# Patient Record
Sex: Female | Born: 1990 | Race: Black or African American | Hispanic: No | Marital: Single | Smoking: Never smoker
Health system: Southern US, Community
[De-identification: ages and names within clinical notes are randomized; demographics above are authoritative.]

## PROBLEM LIST (undated history)

## (undated) DIAGNOSIS — R51 Headache: Secondary | ICD-10-CM

## (undated) DIAGNOSIS — R519 Headache, unspecified: Secondary | ICD-10-CM

## (undated) HISTORY — DX: Headache: R51

## (undated) HISTORY — DX: Headache, unspecified: R51.9

---

## 2008-05-02 ENCOUNTER — Emergency Department (HOSPITAL_COMMUNITY): Admission: EM | Admit: 2008-05-02 | Discharge: 2008-05-02 | Payer: Self-pay | Admitting: Emergency Medicine

## 2009-06-27 ENCOUNTER — Emergency Department (HOSPITAL_COMMUNITY): Admission: EM | Admit: 2009-06-27 | Discharge: 2009-06-27 | Payer: Self-pay | Admitting: Emergency Medicine

## 2010-07-21 ENCOUNTER — Emergency Department (HOSPITAL_COMMUNITY)
Admission: EM | Admit: 2010-07-21 | Discharge: 2010-07-21 | Payer: Self-pay | Source: Home / Self Care | Admitting: Family Medicine

## 2010-07-27 LAB — POCT URINALYSIS DIPSTICK
Bilirubin Urine: NEGATIVE
Ketones, ur: NEGATIVE mg/dL
Nitrite: NEGATIVE
Protein, ur: >=300 mg/dL — AB
Specific Gravity, Urine: >=1.03 (ref 1.005–1.030)
Urine Glucose, Fasting: NEGATIVE mg/dL
Urobilinogen, UA: 0.2 mg/dL (ref 0.0–1.0)
pH: 6 (ref 5.0–8.0)

## 2010-07-27 LAB — POCT PREGNANCY, URINE: Preg Test, Ur: NEGATIVE

## 2010-10-11 ENCOUNTER — Emergency Department (HOSPITAL_COMMUNITY)
Admission: EM | Admit: 2010-10-11 | Discharge: 2010-10-11 | Disposition: A | Discharge status: Home / Self Care | Payer: BC Managed Care – PPO | Attending: Emergency Medicine | Admitting: Emergency Medicine

## 2010-10-11 DIAGNOSIS — R599 Enlarged lymph nodes, unspecified: Secondary | ICD-10-CM | POA: Insufficient documentation

## 2010-10-11 DIAGNOSIS — IMO0001 Reserved for inherently not codable concepts without codable children: Secondary | ICD-10-CM | POA: Insufficient documentation

## 2010-10-11 DIAGNOSIS — R630 Anorexia: Secondary | ICD-10-CM | POA: Insufficient documentation

## 2010-10-11 DIAGNOSIS — J020 Streptococcal pharyngitis: Secondary | ICD-10-CM | POA: Insufficient documentation

## 2010-10-11 DIAGNOSIS — R070 Pain in throat: Secondary | ICD-10-CM | POA: Insufficient documentation

## 2010-10-11 DIAGNOSIS — R509 Fever, unspecified: Secondary | ICD-10-CM | POA: Insufficient documentation

## 2010-10-11 LAB — RAPID STREP SCREEN (MED CTR MEBANE ONLY): Streptococcus, Group A Screen (Direct): POSITIVE — AB

## 2010-12-15 ENCOUNTER — Inpatient Hospital Stay (INDEPENDENT_AMBULATORY_CARE_PROVIDER_SITE_OTHER)
Admission: RE | Admit: 2010-12-15 | Discharge: 2010-12-15 | Disposition: A | Discharge status: Home / Self Care | Payer: BC Managed Care – PPO | Source: Ambulatory Visit | Attending: Family Medicine | Admitting: Family Medicine

## 2010-12-15 DIAGNOSIS — N760 Acute vaginitis: Secondary | ICD-10-CM

## 2010-12-15 DIAGNOSIS — R300 Dysuria: Secondary | ICD-10-CM

## 2010-12-15 DIAGNOSIS — A499 Bacterial infection, unspecified: Secondary | ICD-10-CM

## 2010-12-15 LAB — POCT URINALYSIS DIP (DEVICE)
Bilirubin Urine: NEGATIVE
Glucose, UA: NEGATIVE mg/dL
Hgb urine dipstick: NEGATIVE
Ketones, ur: NEGATIVE mg/dL
Nitrite: NEGATIVE
Protein, ur: NEGATIVE mg/dL
Specific Gravity, Urine: 1.025 (ref 1.005–1.030)
Urobilinogen, UA: 0.2 mg/dL (ref 0.0–1.0)
pH: 6 (ref 5.0–8.0)

## 2010-12-15 LAB — POCT PREGNANCY, URINE: Preg Test, Ur: NEGATIVE

## 2010-12-15 LAB — WET PREP, GENITAL
Clue Cells Wet Prep HPF POC: NONE SEEN
Trich, Wet Prep: NONE SEEN
Yeast Wet Prep HPF POC: NONE SEEN

## 2010-12-16 LAB — GC/CHLAMYDIA PROBE AMP, GENITAL
Chlamydia, DNA Probe: NEGATIVE
GC Probe Amp, Genital: NEGATIVE

## 2012-08-28 ENCOUNTER — Telehealth: Payer: Self-pay

## 2012-08-28 NOTE — Telephone Encounter (Signed)
Pt is requesting to pick of a copy of her tb that she had done maybe a year ago please call [phone] when ready

## 2012-08-28 NOTE — Telephone Encounter (Signed)
Patient notified today that they are ready for pick up. Patient says she will get them tomorrow.

## 2012-09-04 ENCOUNTER — Ambulatory Visit (INDEPENDENT_AMBULATORY_CARE_PROVIDER_SITE_OTHER): Payer: BC Managed Care – PPO | Admitting: Family Medicine

## 2012-09-04 VITALS — BP 113/69 | HR 79 | Temp 98.5°F | Resp 16 | Ht 66.0 in | Wt 146.8 lb

## 2012-09-04 DIAGNOSIS — Z0000 Encounter for general adult medical examination without abnormal findings: Secondary | ICD-10-CM

## 2012-09-04 NOTE — Patient Instructions (Signed)
Return as needed. Remember our discussion regarding healthy lifestyle choices.

## 2012-09-04 NOTE — Progress Notes (Signed)
Physical examination  Current medical condition: 22 year old lady who is here for physical exam. 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 She also is in graduate school to early childhood education. She has no major acute medical complaints.  Past medical history: Previous surgeries: None  previous medical illnesses: None Prescription medications: None except for getting the periodic Depo-Provera and injections. She takes some supplements. Allergies: None  Family history: Mother is healthy Father has HIV, dementia. He is estranged from his daughter and they really don't have a relationship 2 siblings who have some health issues  Social history: Patient is single. She has one boyfriend who she she is sexually involved with. She does not smoke or use any drugs. She occasionally drinks alcohol. 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  Review of systems: Constitutional: Unremarkable HEENT: Unremarkable Respiratory: Unremarkable Cardiovascular: Unremarkable GI: Unremarkable GU: Unremarkable The skeletal: Unremarkable. She does have some discomfort in the anterior aspect of her neck recently. Is fairly nonspecific in nature. Neurologic: Unremarkable Endocrinologic: Unremarkable GYN: Unremarkable Does not have periods because of her injections.  Physical examination: Well-developed well-nourished young lady in no major distress. Her HEENT is normal. Eyes PERRLA. Fundi benign. Throat clear. Neck supple without nodes thyromegaly. No carotid bruits. Chest clear to auscultation. Heart regular without murmurs gallops or arrhythmias. And soft without masses tenderness. Spine normal. Extremities unremarkable. Skin unremarkable. She has a couple of tattoos, the right shoulder and right leg. Deep tender reflexes trace, symmetrical.  Assessment: Normal physical examination Nonspecific anterior cervical pain  Plan: Form completed Return when necessary

## 2012-09-16 ENCOUNTER — Encounter: Payer: Self-pay | Admitting: Physician Assistant

## 2012-09-16 ENCOUNTER — Ambulatory Visit (INDEPENDENT_AMBULATORY_CARE_PROVIDER_SITE_OTHER): Payer: BC Managed Care – PPO | Admitting: Physician Assistant

## 2012-09-16 VITALS — BP 116/76 | HR 66 | Temp 98.3°F | Resp 16 | Ht 66.0 in | Wt 146.0 lb

## 2012-09-16 NOTE — Progress Notes (Signed)
  Tuberculosis Risk Questionnaire  1. Were you born outside the [place] in one of the following parts of the world:    [place], [place], [place], [place] or [place]?  No  2. Have you traveled outside the [place] and lived for more than one month in one of the following parts of the world:  [place], [place], [place], [place] or [place]?  No  3. Do you have a compromised immune system such as from any of the following conditions:  HIV/AIDS, organ or bone marrow transplantation, diabetes, immunosuppressive   medicines (e.g. Prednisone, Remicaide), leukemia, lymphoma, cancer of the   head or neck, gastrectomy or jejunal bypass, end-stage renal disease (on   dialysis), or silicosis?  No    4. Have you ever done one of the following:    Used crack cocaine, injected illegal drugs, worked or resided in jail or prison,   worked or resided at a homeless shelter, or worked as a Research scientist (physical sciences) in   direct contact with patients?  No  5. Have you ever been exposed to anyone with infectious tuberculosis?  Yes  Grandmother had tuberculosis she passed away 15 yrs ago   Tuberculosis Symptom Questionnaire  Do you currently have any of the following symptoms?  1. Unexplained cough lasting more than 3 weeks? No  Unexplained fever lasting more than 3 weeks. No   3. Night Sweats (sweating that leaves the bedclothes and sheets wet)   No  4. Shortness of Breath No  5. Chest Pain No  6. Unintentional weight loss  No  7. Unexplained fatigue (very tired for no reason) No  Patient with one positive yes answer discussed with Porfirio Oar and TB skin test applied patient to return in 48-72 hours for reading.

## 2012-09-16 NOTE — Progress Notes (Signed)
TB screening reviewed.  Patient was exposed to TB when her grandmother was infected.  Per CDC recommendations, she needs additional screening. PPD placed.  RTC 48-72 hours for reading.

## 2012-09-18 ENCOUNTER — Encounter: Payer: Self-pay | Admitting: *Deleted

## 2012-09-18 ENCOUNTER — Ambulatory Visit (INDEPENDENT_AMBULATORY_CARE_PROVIDER_SITE_OTHER): Payer: BC Managed Care – PPO | Admitting: *Deleted

## 2012-09-18 LAB — TB SKIN TEST
Induration: 0 mm
TB Skin Test: NEGATIVE

## 2013-04-05 ENCOUNTER — Emergency Department (HOSPITAL_COMMUNITY): Payer: BC Managed Care – PPO

## 2013-04-05 ENCOUNTER — Emergency Department (HOSPITAL_COMMUNITY)
Admission: EM | Admit: 2013-04-05 | Discharge: 2013-04-05 | Disposition: A | Discharge status: Home / Self Care | Payer: BC Managed Care – PPO | Attending: Emergency Medicine | Admitting: Emergency Medicine

## 2013-04-05 ENCOUNTER — Encounter (HOSPITAL_COMMUNITY): Payer: Self-pay | Admitting: Emergency Medicine

## 2013-04-05 DIAGNOSIS — R079 Chest pain, unspecified: Secondary | ICD-10-CM

## 2013-04-05 DIAGNOSIS — Z3202 Encounter for pregnancy test, result negative: Secondary | ICD-10-CM | POA: Insufficient documentation

## 2013-04-05 LAB — CBC
HCT: 41.7 % (ref 36.0–46.0)
Hemoglobin: 14.6 g/dL (ref 12.0–15.0)
MCH: 31.3 pg (ref 26.0–34.0)
MCHC: 35 g/dL (ref 30.0–36.0)
MCV: 89.5 fL (ref 78.0–100.0)
Platelets: 195 10*3/uL (ref 150–400)
RBC: 4.66 MIL/uL (ref 3.87–5.11)
RDW: 12.6 % (ref 11.5–15.5)
WBC: 2.7 10*3/uL — ABNORMAL LOW (ref 4.0–10.5)

## 2013-04-05 LAB — BASIC METABOLIC PANEL
BUN: 12 mg/dL (ref 6–23)
CO2: 26 mEq/L (ref 19–32)
Calcium: 9.7 mg/dL (ref 8.4–10.5)
Chloride: 103 mEq/L (ref 96–112)
Creatinine, Ser: 0.87 mg/dL (ref 0.50–1.10)
GFR calc Af Amer: >90 mL/min (ref >90)
GFR calc non Af Amer: >90 mL/min (ref >90)
Glucose, Bld: 81 mg/dL (ref 70–99)
Potassium: 3.6 mEq/L (ref 3.5–5.1)
Sodium: 138 mEq/L (ref 135–145)

## 2013-04-05 LAB — POCT I-STAT TROPONIN I: Troponin i, poc: 0 ng/mL (ref 0.00–0.08)

## 2013-04-05 LAB — POCT PREGNANCY, URINE: Preg Test, Ur: NEGATIVE

## 2013-04-05 MED ORDER — OMEPRAZOLE 20 MG PO CPDR: 20.0000 mg | DELAYED_RELEASE_CAPSULE | Freq: Every day | ORAL | Status: DC

## 2013-04-05 MED ORDER — GI COCKTAIL ~~LOC~~
30.0000 mL | Freq: Once | ORAL | Status: AC
  Administered 2013-04-05: 30 mL via ORAL
  Filled 2013-04-05: qty 30

## 2013-04-05 MED ORDER — FAMOTIDINE 20 MG PO TABS: 20.0000 mg | ORAL_TABLET | Freq: Two times a day (BID) | ORAL | Status: DC

## 2013-04-05 NOTE — ED Provider Notes (Signed)
CSN: [id number]     Arrival date & time 04/05/13  1126 History   First MD Initiated Contact with Patient 04/05/13 1334     Chief Complaint  Patient presents with  . Chest Pain  . Abdominal Pain   (Consider location/radiation/quality/duration/timing/severity/associated sxs/prior Treatment) HPI  22 year old female with no significant past medical history presents complaining of epigastric abdominal pain. Patient states pain is waxing and waning for the past 3 or 4 days. Describe pain as a "rolling sensation" with occasional sharp pain, epigastric and radiates to her mid chest. Pain worsening at night when she lays down and woke her up this morning.  As it worse pain was 10 out of 10 but pain is currently 4/10. Endorse mild nausea but no vomiting or diarrhea. Denies any fever, chills, exertional chest pain, pleuritic chest pain, productive cough, hemoptysis, shortness of breath, back pain, dysuria, or rash. Denies any lightheadedness and dizziness. No prior history of GERD. Pt tried taking Gas-X without relief.  Her last menstrual period was in May currently on Depo-Provera. Her last bowel movement was yesterday and was normal. Patient otherwise is a nonsmoker. No significant family history of premature cardiac death. No history of diabetes.  History reviewed. No pertinent past medical history. History reviewed. No pertinent past surgical history. Family History  Problem Relation Age of Onset  . HIV/AIDS Father   . Asthma Sister   . Hypertension Maternal Grandmother    History  Substance Use Topics  . Smoking status: Never Smoker   . Smokeless tobacco: Not on file  . Alcohol Use: Yes     Comment: drinks occasionally   OB History   Grav Para Term Preterm Abortions TAB SAB Ect Mult Living                 Review of Systems  All other systems reviewed and are negative.    Allergies  Review of patient's allergies indicates no known allergies.  Home Medications   Current Outpatient  Rx  Name  Route  Sig  Dispense  Refill  . Ascorbic Acid (VITAMIN C PO)   Oral   Take 1 tablet by mouth daily.         Marland Kitchen BIOTIN PO   Oral   Take 1 tablet by mouth daily.         Marland Kitchen CALCIUM PO   Oral   Take 1 tablet by mouth 2 (two) times daily.         . medroxyPROGESTERone (DEPO-PROVERA) 400 MG/ML SUSP   Intramuscular   Inject into the muscle once.         . Multiple Vitamins-Minerals (MULTIVITAMIN WITH MINERALS) tablet   Oral   Take 1 tablet by mouth daily.         . Simethicone (ANTI GAS PO)   Oral   Take 2 tablets by mouth once as needed (for gas or upset stomach).          BP 117/72  Pulse 82  Temp(Src) 98.4 F (36.9 C) (Oral)  Resp 18  Ht 5\' 6"  (1.676 m)  Wt 164 lb (74.39 kg)  BMI 26.48 kg/m2  SpO2 97% Physical Exam  Nursing note and vitals reviewed. Constitutional: She appears well-developed and well-nourished. No distress.  Awake, alert, nontoxic appearance  HENT:  Head: Atraumatic.  Eyes: Conjunctivae are normal. Right eye exhibits no discharge. Left eye exhibits no discharge.  Neck: Neck supple.  Cardiovascular: Normal rate and regular rhythm.   Pulmonary/Chest: Effort normal. No  respiratory distress. She exhibits no tenderness.  Abdominal: Soft. There is no tenderness. There is no rebound.  Musculoskeletal: She exhibits no tenderness.  ROM appears intact, no obvious focal weakness  Neurological:  Mental status and motor strength appears intact  Skin: No rash noted.  Psychiatric: She has a normal mood and affect.    ED Course  Procedures (including critical care time)   Date: 04/05/2013  Rate: 81  Rhythm: normal sinus rhythm  QRS Axis: normal  Intervals: normal  ST/T Wave abnormalities: normal  Conduction Disutrbances: none  Narrative Interpretation: poor R wave progression  Old EKG Reviewed: none for comparison     1:47 PM Patient presents with atypical chest pain. Her symptoms is suggestive of acid reflux. She has no  significant cardiac risk factor. She does take Depo-Provera however she denies any shortness of breath to concern for PE.  She has a normal pregnancy test, unremarkable troponin, and her labs are reassuring. Her chest x-ray shows no acute disease. We'll give GI cocktail.    Labs Review Labs Reviewed  CBC - Abnormal; Notable for the following:    WBC 2.7 (*)    All other components within normal limits  BASIC METABOLIC PANEL  POCT I-STAT TROPONIN I  POCT PREGNANCY, URINE   Imaging Review Dg Chest 2 View  04/05/2013   CLINICAL DATA:  Chest pain  EXAM: CHEST  2 VIEW  COMPARISON:  June 27, 2009  FINDINGS: There is no focal infiltrate, pulmonary edema, or pleural effusion. The mediastinal contour and cardiac silhouette are normal. The soft tissues and osseous structures are stable.  IMPRESSION: No active cardiopulmonary disease.   Electronically Signed   By: Sherian Rein   On: 04/05/2013 12:42    MDM   1. Chest pain at rest    BP 97/64  Pulse 64  Temp(Src) 98.4 F (36.9 C) (Oral)  Resp 18  Ht 5\' 6"  (1.676 m)  Wt 164 lb (74.39 kg)  BMI 26.48 kg/m2  SpO2 100%  I have reviewed nursing notes and vital signs. I personally reviewed the imaging tests through PACS system  I reviewed available ER/hospitalization records thought the EMR     Fayrene Helper, [place] 04/06/13 1503

## 2013-04-05 NOTE — ED Notes (Signed)
Pt c/o mid sternal CP and abd pain x 1 week worse after eating

## 2013-04-05 NOTE — ED Notes (Signed)
Pt discharged.Vital signs stable and GCS 15 

## 2013-04-17 NOTE — ED Provider Notes (Signed)
Medical screening examination/treatment/procedure(s) were performed by non-physician practitioner and as supervising physician I was immediately available for consultation/collaboration. Devoria Albe, MD, Armando Gang   Ward Givens, MD 04/17/13 1058

## 2013-11-30 ENCOUNTER — Ambulatory Visit (INDEPENDENT_AMBULATORY_CARE_PROVIDER_SITE_OTHER): Payer: 59 | Admitting: Family Medicine

## 2013-11-30 ENCOUNTER — Telehealth: Payer: Self-pay

## 2013-11-30 VITALS — BP 108/70 | HR 60 | Temp 98.5°F | Resp 18 | Ht 64.2 in | Wt 168.4 lb

## 2013-11-30 DIAGNOSIS — M542 Cervicalgia: Secondary | ICD-10-CM

## 2013-11-30 DIAGNOSIS — Z0000 Encounter for general adult medical examination without abnormal findings: Secondary | ICD-10-CM

## 2013-11-30 DIAGNOSIS — R05 Cough: Secondary | ICD-10-CM

## 2013-11-30 DIAGNOSIS — R059 Cough, unspecified: Secondary | ICD-10-CM

## 2013-11-30 LAB — POCT CBC
Granulocyte percent: 43.4 %G (ref 37–80)
HCT, POC: 40.9 % (ref 37.7–47.9)
Hemoglobin: 12.9 g/dL (ref 12.2–16.2)
Lymph, poc: 2.3 (ref 0.6–3.4)
MCH, POC: 29.6 pg (ref 27–31.2)
MCHC: 31.5 g/dL — AB (ref 31.8–35.4)
MCV: 93.8 fL (ref 80–97)
MID (cbc): 0.3 (ref 0–0.9)
MPV: 10.9 fL (ref 0–99.8)
POC Granulocyte: 2 (ref 2–6.9)
POC LYMPH PERCENT: 49.2 %L (ref 10–50)
POC MID %: 7.4 %M (ref 0–12)
Platelet Count, POC: 196 10*3/uL (ref 142–424)
RBC: 4.36 M/uL (ref 4.04–5.48)
RDW, POC: 13.7 %
WBC: 4.6 10*3/uL (ref 4.6–10.2)

## 2013-11-30 LAB — COMPREHENSIVE METABOLIC PANEL
ALT: 21 U/L (ref 0–35)
AST: 22 U/L (ref 0–37)
Albumin: 4 g/dL (ref 3.5–5.2)
Alkaline Phosphatase: 59 U/L (ref 39–117)
BUN: 18 mg/dL (ref 6–23)
CO2: 23 mEq/L (ref 19–32)
Calcium: 9.5 mg/dL (ref 8.4–10.5)
Chloride: 106 mEq/L (ref 96–112)
Creat: 0.86 mg/dL (ref 0.50–1.10)
Glucose, Bld: 80 mg/dL (ref 70–99)
Potassium: 4 mEq/L (ref 3.5–5.3)
Sodium: 139 mEq/L (ref 135–145)
Total Bilirubin: 0.4 mg/dL (ref 0.2–1.2)
Total Protein: 7.6 g/dL (ref 6.0–8.3)

## 2013-11-30 LAB — POCT URINALYSIS DIPSTICK
Bilirubin, UA: NEGATIVE
Blood, UA: NEGATIVE
Glucose, UA: NEGATIVE
Ketones, UA: NEGATIVE
Leukocytes, UA: NEGATIVE
Nitrite, UA: POSITIVE
Protein, UA: NEGATIVE
Spec Grav, UA: 1.02
Urobilinogen, UA: 0.2
pH, UA: 7

## 2013-11-30 LAB — LIPID PANEL
Cholesterol: 154 mg/dL (ref 0–200)
HDL: 56 mg/dL (ref >39)
LDL Cholesterol: 88 mg/dL (ref 0–99)
Total CHOL/HDL Ratio: 2.8 Ratio
Triglycerides: 50 mg/dL (ref <150)
VLDL: 10 mg/dL (ref 0–40)

## 2013-11-30 LAB — POCT SEDIMENTATION RATE: POCT SED RATE: 51 mm/hr — AB (ref 0–22)

## 2013-11-30 MED ORDER — CYCLOBENZAPRINE HCL 5 MG PO TABS: 5.0000 mg | ORAL_TABLET | Freq: Every day | ORAL | Status: DC

## 2013-11-30 MED ORDER — FLUTICASONE FUROATE 100 MCG/ACT IN AEPB: 1.0000 | INHALATION_SPRAY | Freq: Two times a day (BID) | RESPIRATORY_TRACT | Status: DC

## 2013-11-30 MED ORDER — MELOXICAM 7.5 MG PO TABS: 7.5000 mg | ORAL_TABLET | Freq: Every day | ORAL | Status: DC

## 2013-11-30 MED ORDER — FLUTICASONE PROPIONATE HFA 110 MCG/ACT IN AERO: 1.0000 | INHALATION_SPRAY | Freq: Two times a day (BID) | RESPIRATORY_TRACT | Status: DC

## 2013-11-30 NOTE — Progress Notes (Signed)
°  This chart was scribed for Anita Hai, MD by Joaquin Music, ED Scribe. This patient was seen in room Room/bed 2 and the patient's care was started at 1:59 PM. Subjective:    Patient ID: Frederich Chick, female    DOB: 1991/02/10, 23 y.o.   MRN: [id number] Chief Complaint  Patient presents with   Annual Exam    with no pap   HPI ANNALEIGH KNOCHE is a 23 y.o. female who presents to the [facility] for annual physical exam. States her last physical was December 2014. Pt states she has been having neck pain with associated intermittent numbness for the past 3 months but for the past month it has been constant. Pt states the pain stretches to her shoulder blades but no further. States she has been stretching but denies any relief. Reports having a hx of lower back back. She denies having an new or reoccurring sx.   She also c/o having a spontaneous productive cough for the past year. States she noticed her cough began when she was using ammonium to clean and states the cough has not subsided since initial use. She denies having hx of asthma or smoking.Family hx of HTN. Uses Depoprivera as BC given by OBGYN. Excercises frequently that consist of cardio.  Works in [employer]. Denies heavy lifting while at work.  Review of Systems  Musculoskeletal: Positive for myalgias and neck pain. Negative for neck stiffness.  All other systems reviewed and are negative.  Objective:   Physical Exam  Nursing note and vitals reviewed. Constitutional: She is oriented to person, place, and time. She appears well-developed and well-nourished. No distress.  HENT:  Head: Normocephalic and atraumatic.  Right Ear: External ear normal.  Left Ear: External ear normal.  Nose: Nose normal.  Mouth/Throat: Oropharynx is clear and moist.  Eyes: Conjunctivae and EOM are normal. Pupils are equal, round, and reactive to light.  Neck: Normal range of motion. Neck supple. No tracheal deviation present.  Mildly tight  muscles to her neck.  Cardiovascular: Normal rate, regular rhythm, normal heart sounds and intact distal pulses.   Pulmonary/Chest: Effort normal and breath sounds normal. No respiratory distress.  Abdominal: Soft. Bowel sounds are normal.  Musculoskeletal: Normal range of motion.  Neurological: She is alert and oriented to person, place, and time. She has normal reflexes.  Normal reflexes.  Skin: Skin is warm and dry.  Psychiatric: She has a normal mood and affect. Her behavior is normal.   Triage Vitals:BP 108/70   Pulse 60   Temp(Src) 98.5 F (36.9 C) (Oral)   Resp 18   Ht 5' 4.2" (1.631 m)   Wt 168 lb 6.4 oz (76.386 kg)   BMI 28.71 kg/m2   SpO2 100% Assessment & Plan:  Will complete labs and discharge pt with inhaler and medication for neck pain.   Cough - Plan: Fluticasone Furoate (ARNUITY ELLIPTA) 100 MCG/ACT AEPB  Neck pain - Plan: meloxicam (MOBIC) 7.5 MG tablet, cyclobenzaprine (FLEXERIL) 5 MG tablet, POCT SEDIMENTATION RATE  Annual physical exam - Plan: POCT CBC, Comprehensive metabolic panel, Lipid panel, POCT urinalysis dipstick  Signed, Elvina Sidle, MD

## 2013-11-30 NOTE — Telephone Encounter (Signed)
Pharm called to advise that Fluticasone furonate is not covered under insurance and asked if we can change it to Flovent. Per Dr Elbert Ewings, Ok'd change to Flovent.

## 2013-12-05 ENCOUNTER — Telehealth: Payer: Self-pay

## 2013-12-05 DIAGNOSIS — R059 Cough, unspecified: Secondary | ICD-10-CM

## 2013-12-05 DIAGNOSIS — R05 Cough: Secondary | ICD-10-CM

## 2013-12-05 NOTE — Telephone Encounter (Signed)
PT STATES SHE WAS GIVEN A COUPON FOR AN INHALER BUT WHEN SHE WENT TO THE PHARMACY THEY GAVE HER A DIFFERENT ONE WHICH HER INSURANCE DOESN'T COVER WOULD LIKE TO SPEAK WITH SOMEONE ABOUT IT. PLEASE CALL [phone]

## 2013-12-06 MED ORDER — ARNUITY ELLIPTA 100 MCG/ACT IN AEPB: 1.0000 | INHALATION_SPRAY | Freq: Two times a day (BID) | RESPIRATORY_TRACT | Status: DC

## 2013-12-06 NOTE — Telephone Encounter (Signed)
Pt tried to use the coupon given to her for the inhaler for the prescription that was filled with a generic form. Resent Rx to pharmacy with DAW so patient may use the free inhaler coupon. Pt will go back to the pharmacy to pick up.

## 2014-01-07 ENCOUNTER — Ambulatory Visit (INDEPENDENT_AMBULATORY_CARE_PROVIDER_SITE_OTHER): Payer: 59 | Admitting: Family Medicine

## 2014-01-07 VITALS — BP 118/75 | HR 66 | Temp 98.6°F | Ht 64.0 in | Wt 168.2 lb

## 2014-01-07 DIAGNOSIS — J029 Acute pharyngitis, unspecified: Secondary | ICD-10-CM

## 2014-01-07 DIAGNOSIS — J309 Allergic rhinitis, unspecified: Secondary | ICD-10-CM

## 2014-01-07 LAB — POCT RAPID STREP A (OFFICE): Rapid Strep A Screen: NEGATIVE

## 2014-01-07 MED ORDER — FLUTICASONE PROPIONATE 50 MCG/ACT NA SUSP: 2.0000 | Freq: Every day | NASAL | Status: DC

## 2014-01-07 NOTE — Patient Instructions (Signed)
Strep test was negative, culture will be sent and we will let you know results in 2-3 days Can take ibuprofen 2-3 tablets every 8 hours for pain Gargle with warm salt water and can use chloraseptic spray over the counter as needed for throat pain Start nasal spray and use once a day for a month Return if you get worse- fever greater than 101 for greater than 2 days, persistent nausea/vomiting  Pharyngitis Pharyngitis is redness, pain, and swelling (inflammation) of your pharynx.  CAUSES  Pharyngitis is usually caused by infection. Most of the time, these infections are from viruses (viral) and are part of a cold. However, sometimes pharyngitis is caused by bacteria (bacterial). Pharyngitis can also be caused by allergies. Viral pharyngitis may be spread from person to person by coughing, sneezing, and personal items or utensils (cups, forks, spoons, toothbrushes). Bacterial pharyngitis may be spread from person to person by more intimate contact, such as kissing.  SIGNS AND SYMPTOMS  Symptoms of pharyngitis include:   Sore throat.   Tiredness (fatigue).   Low-grade fever.   Headache.  Joint pain and muscle aches.  Skin rashes.  Swollen lymph nodes.  Plaque-like film on throat or tonsils (often seen with bacterial pharyngitis). DIAGNOSIS  Your health care provider will ask you questions about your illness and your symptoms. Your medical history, along with a physical exam, is often all that is needed to diagnose pharyngitis. Sometimes, a rapid strep test is done. Other lab tests may also be done, depending on the suspected cause.  TREATMENT  Viral pharyngitis will usually get better in 3-4 days without the use of medicine. Bacterial pharyngitis is treated with medicines that kill germs (antibiotics).  HOME CARE INSTRUCTIONS   Drink enough water and fluids to keep your urine clear or pale yellow.   Only take over-the-counter or prescription medicines as directed by your health  care provider:   If you are prescribed antibiotics, make sure you finish them even if you start to feel better.   Do not take aspirin.   Get lots of rest.   Gargle with 8 oz of salt water ( tsp of salt per 1 qt of water) as often as every 1-2 hours to soothe your throat.   Throat lozenges (if you are not at risk for choking) or sprays may be used to soothe your throat. SEEK MEDICAL CARE IF:   You have large, tender lumps in your neck.  You have a rash.  You cough up green, yellow-brown, or bloody spit. SEEK IMMEDIATE MEDICAL CARE IF:   Your neck becomes stiff.  You drool or are unable to swallow liquids.  You vomit or are unable to keep medicines or liquids down.  You have severe pain that does not go away with the use of recommended medicines.  You have trouble breathing (not caused by a stuffy nose). MAKE SURE YOU:   Understand these instructions.  Will watch your condition.  Will get help right away if you are not doing well or get worse. Document Released: 06/28/2005 Document Revised: 04/18/2013 Document Reviewed: 03/05/2013 [facility] Patient Information 2015 [place], [place]. This information is not intended to replace advice given to you by your health care provider. Make sure you discuss any questions you have with your health care provider.

## 2014-01-07 NOTE — Progress Notes (Signed)
   Subjective:    Patient ID: Anita Leblanc, female    DOB: [birth year], 23 y.o.   MRN: [id number]  HPI This is a very pleasant 23 yo female who presents today with 3 day history of coughing, sneezing, congestion, watery eyes. Has also had sore throat x 2 days and noticed spots on the back of her throat. 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  She does not typically have allergic symptoms, but this spring has had more nasal congestion. Has taken an OTC cold preparation with some relief of symptoms.  Review of Systems Subjective fever yesterday, dry cough, ear pressure, no SOB, no myalgias/arthalgias, no rash.    Objective:   Physical Exam  Vitals reviewed. Constitutional: She is oriented to person, place, and time. She appears well-developed and well-nourished. No distress.  HENT:  Head: Normocephalic and atraumatic.  Right Ear: Tympanic membrane, external ear and ear canal normal.  Left Ear: Tympanic membrane, external ear and ear canal normal.  Nose: Mucosal edema and rhinorrhea present. Right sinus exhibits maxillary sinus tenderness. Right sinus exhibits no frontal sinus tenderness. Left sinus exhibits maxillary sinus tenderness. Left sinus exhibits no frontal sinus tenderness.  Mouth/Throat: Uvula is midline and mucous membranes are normal. Posterior oropharyngeal edema and posterior oropharyngeal erythema present. No oropharyngeal exudate.  Eyes: Conjunctivae are normal. Right eye exhibits no discharge. Left eye exhibits no discharge.  Neck: Normal range of motion. Neck supple.  Cardiovascular: Normal rate, regular rhythm and normal heart sounds.   Pulmonary/Chest: Effort normal and breath sounds normal.  Abdominal: Soft.  Musculoskeletal: Normal range of motion.  Lymphadenopathy:    She has no cervical adenopathy.  Neurological: She is alert and oriented to person, place, and time.  Skin: Skin is warm and dry. No rash noted. She is not  diaphoretic.  Psychiatric: She has a normal mood and affect. Her behavior is normal. Judgment and thought content normal.       Assessment & Plan:  1. Acute pharyngitis, unspecified pharyngitis type - POCT rapid strep A - Culture, Group A Strep  2. Allergic rhinitis, unspecified allergic rhinitis type - fluticasone (FLONASE) 50 MCG/ACT nasal spray; Place 2 sprays into both nostrils daily.  Dispense: 16 g; Refill: 6 -provided written and verbal information regarding diagnoses, symptomatic relief.  -RTC if no improvement in 5-7 days or if worsening symptoms    Emi Belfasteborah B. Gessner, FNP-BC  [facility], [facility]  01/07/2014 8:19 PM

## 2014-01-10 LAB — CULTURE, GROUP A STREP: Organism ID, Bacteria: NORMAL

## 2014-05-10 ENCOUNTER — Encounter: Payer: 59 | Admitting: Family Medicine

## 2014-05-13 NOTE — Progress Notes (Signed)
This encounter was created in error - please disregard.

## 2014-10-25 ENCOUNTER — Ambulatory Visit (INDEPENDENT_AMBULATORY_CARE_PROVIDER_SITE_OTHER): Payer: 59 | Admitting: Family Medicine

## 2014-10-25 VITALS — BP 110/68 | HR 78 | Temp 97.8°F | Resp 16 | Ht 64.0 in | Wt 185.0 lb

## 2014-10-25 DIAGNOSIS — Z7189 Other specified counseling: Secondary | ICD-10-CM | POA: Diagnosis not present

## 2014-10-25 DIAGNOSIS — Z7185 Encounter for immunization safety counseling: Secondary | ICD-10-CM

## 2014-10-25 DIAGNOSIS — Z23 Encounter for immunization: Secondary | ICD-10-CM

## 2014-10-25 NOTE — Progress Notes (Signed)
Subjective:  This chart was scribed for Anita Ray, MD by Anita Leblanc, Medical scribe. This patient was seen in ROOM 4 and the patient's care was started 4:47 PM.  Patient ID: Anita Leblanc, female    DOB: 08-01-1990, 24 y.o.   MRN: [id number]   Chief Complaint  Patient presents with  . Immunizations    Tetanus and Vericella for school   HPI HPI Comments: Anita Leblanc is a 24 y.o. female with no medical history who presents to [facility] for her tetanus and Vericella immunizations for [employer] in the fall. 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 Pt states she is UTD on her MMR vaccine. She says that she did not receive a flu shot last year. Pt states that her mother told her she had chicken pox when she was a child but she does not remember. She reports no reactions to prior vaccinations. She reports no current illness or fevers recently.   There are no active problems to display for this patient.  History reviewed. No pertinent past medical history.   History reviewed. No pertinent past surgical history.   No Known Allergies   Prior to Admission medications   Medication Sig Start Date End Date Taking? Authorizing Provider  norgestimate-ethinyl estradiol (ORTHO-CYCLEN,SPRINTEC,PREVIFEM) 0.25-35 MG-MCG tablet Take 1 tablet by mouth daily.   Yes Historical Provider, MD  Ascorbic Acid (VITAMIN C PO) Take 1 tablet by mouth daily.    Historical Provider, MD  Multiple Vitamins-Minerals (MULTIVITAMIN WITH MINERALS) tablet Take 1 tablet by mouth daily.    Historical Provider, MD   History   Social History  . Marital Status: Single    Spouse Name: N/A  . Number of Children: N/A  . Years of Education: N/A   Occupational History  . Not on file.   Social History Main Topics  . Smoking status: Never Smoker   . Smokeless tobacco: Not on file  . Alcohol Use: 0.0 oz/week    0 Standard drinks or equivalent per week     Comment: drinks occasionally  . Drug Use:  No  . Sexual Activity: No   Other Topics Concern  . Not on file   Social History Narrative   Review of Systems  Constitutional: Negative for fever.       Immunizations      Objective:   Physical Exam  Constitutional: She is oriented to person, place, and time. She appears well-developed and well-nourished. No distress.  HENT:  Head: Normocephalic and atraumatic.  Right Ear: Hearing, tympanic membrane, external ear and ear canal normal.  Left Ear: Hearing, tympanic membrane, external ear and ear canal normal.  Nose: Nose normal.  Mouth/Throat: Oropharynx is clear and moist. No oropharyngeal exudate.  Eyes: Conjunctivae and EOM are normal. Pupils are equal, round, and reactive to light. Right eye exhibits no discharge. Left eye exhibits no discharge.  Neck: Neck supple. No tracheal deviation present.  Cardiovascular: Normal rate, regular rhythm, normal heart sounds and intact distal pulses.   No murmur heard. Pulmonary/Chest: Effort normal and breath sounds normal. No respiratory distress. She has no wheezes. She has no rhonchi. She has no rales.  Abdominal: She exhibits no distension.  Neurological: She is alert and oriented to person, place, and time.  Skin: Skin is warm and dry. No rash noted.  Psychiatric: She has a normal mood and affect. Her behavior is normal.  Nursing note and vitals reviewed.  Filed Vitals:   10/25/14 1556  BP: 110/68  Pulse: 78  Temp: 97.8 F (36.6 C)  TempSrc: Oral  Resp: 16  Height: '5\' 4"'  (1.626 m)  Weight: 185 lb (83.915 kg)  SpO2: 97%      Assessment & Plan:   Anita Leblanc is a 24 y.o. female Need for Tdap vaccination - Plan: Tdap vaccine greater than or equal to 7yo IM  - tdap given.   Immunization counseling - Plan: Varicella zoster antibody, IgG drawn.   Advised if other immunizations needed or paperwork to be completed for school to let me know.     Meds ordered this encounter  Medications  . norgestimate-ethinyl estradiol  (ORTHO-CYCLEN,SPRINTEC,PREVIFEM) 0.25-35 MG-MCG tablet    Sig: Take 1 tablet by mouth daily.   Patient Instructions  You should receive a call or letter about your lab results within the next week to 10 days.  Let me know if further information needed from your school, and good luck.   Tdap Vaccine (Tetanus, Diphtheria, Pertussis): What You Need to Know 1. Why get vaccinated? Tetanus, diphtheria and pertussis can be very serious diseases, even for adolescents and adults. Tdap vaccine can protect [place] from these diseases. TETANUS (Lockjaw) causes painful muscle tightening and stiffness, usually all over the body.  It can lead to tightening of muscles in the head and neck so you can't open your mouth, swallow, or sometimes even breathe. Tetanus kills about 1 out of 5 people who are infected. DIPHTHERIA can cause a thick coating to form in the back of the throat.  It can lead to breathing problems, paralysis, heart failure, and death. PERTUSSIS (Whooping Cough) causes severe coughing spells, which can cause difficulty breathing, vomiting and disturbed sleep.  It can also lead to weight loss, incontinence, and rib fractures. Up to 2 in 100 adolescents and 5 in 100 adults with pertussis are hospitalized or have complications, which could include pneumonia or death. These diseases are caused by bacteria. Diphtheria and pertussis are spread from person to person through coughing or sneezing. Tetanus enters the body through cuts, scratches, or wounds. Before vaccines, the [place] saw as many as 200,000 cases a year of diphtheria and pertussis, and hundreds of cases of tetanus. Since vaccination began, tetanus and diphtheria have dropped by about 99% and pertussis by about 80%. 2. Tdap vaccine Tdap vaccine can protect adolescents and adults from tetanus, diphtheria, and pertussis. One dose of Tdap is routinely given at age 34 or 53. People who did not get Tdap at that age should get it as soon as  possible. Tdap is especially important for health care professionals and anyone having close contact with a baby younger than 12 months. Pregnant women should get a dose of Tdap during every pregnancy, to protect the newborn from pertussis. Infants are most at risk for severe, life-threatening complications from pertussis. A similar vaccine, called Td, protects from tetanus and diphtheria, but not pertussis. A Td booster should be given every 10 years. Tdap may be given as one of these boosters if you have not already gotten a dose. Tdap may also be given after a severe cut or burn to prevent tetanus infection. Your doctor can give you more information. Tdap may safely be given at the same time as other vaccines. 3. Some people should not get this vaccine  If you ever had a life-threatening allergic reaction after a dose of any tetanus, diphtheria, or pertussis containing vaccine, OR if you have a severe allergy to any part of this  vaccine, you should not get Tdap. Tell your doctor if you have any severe allergies.  If you had a coma, or long or multiple seizures within 7 days after a childhood dose of DTP or DTaP, you should not get Tdap, unless a cause other than the vaccine was found. You can still get Td.  Talk to your doctor if you:  have epilepsy or another nervous system problem,  had severe pain or swelling after any vaccine containing diphtheria, tetanus or pertussis,  ever had Guillain-Barr Syndrome (GBS),  aren't feeling well on the day the shot is scheduled. 4. Risks of a vaccine reaction With any medicine, including vaccines, there is a chance of side effects. These are usually mild and go away on their own, but serious reactions are also possible. Brief fainting spells can follow a vaccination, leading to injuries from falling. Sitting or lying down for about 15 minutes can help prevent these. Tell your doctor if you feel dizzy or light-headed, or have vision changes or ringing  in the ears. Mild problems following Tdap (Did not interfere with activities)  Pain where the shot was given (about 3 in 4 adolescents or 2 in 3 adults)  Redness or swelling where the shot was given (about 1 person in 5)  Mild fever of at least 100.5F (up to about 1 in 25 adolescents or 1 in 100 adults)  Headache (about 3 or 4 people in 10)  Tiredness (about 1 person in 3 or 4)  Nausea, vomiting, diarrhea, stomach ache (up to 1 in 4 adolescents or 1 in 10 adults)  Chills, body aches, sore joints, rash, swollen glands (uncommon) Moderate problems following Tdap (Interfered with activities, but did not require medical attention)  Pain where the shot was given (about 1 in 5 adolescents or 1 in 100 adults)  Redness or swelling where the shot was given (up to about 1 in 16 adolescents or 1 in 25 adults)  Fever over 102F (about 1 in 100 adolescents or 1 in 250 adults)  Headache (about 3 in 20 adolescents or 1 in 10 adults)  Nausea, vomiting, diarrhea, stomach ache (up to 1 or 3 people in 100)  Swelling of the entire arm where the shot was given (up to about 3 in 100). Severe problems following Tdap (Unable to perform usual activities; required medical attention)  Swelling, severe pain, bleeding and redness in the arm where the shot was given (rare). A severe allergic reaction could occur after any vaccine (estimated less than 1 in a million doses). 5. What if there is a serious reaction? What should I look for?  Look for anything that concerns you, such as signs of a severe allergic reaction, very high fever, or behavior changes. Signs of a severe allergic reaction can include hives, swelling of the face and throat, difficulty breathing, a fast heartbeat, dizziness, and weakness. These would start a few minutes to a few hours after the vaccination. What should I do?  If you think it is a severe allergic reaction or other emergency that can't wait, call 9-1-1 or get the person  to the nearest hospital. Otherwise, call your doctor.  Afterward, the reaction should be reported to the "Vaccine Adverse Event Reporting System" (VAERS). Your doctor might file this report, or you can do it yourself through the VAERS web site at www.vaers.SamedayNews.es, or by calling (418) 001-5214. VAERS is only for reporting reactions. They do not give medical advice.  6. The National Vaccine Injury Fiserv The  National Sport and exercise psychologist (VICP) is a Technical brewer that was created to compensate people who may have been injured by certain vaccines. Persons who believe they may have been injured by a vaccine can learn about the program and about filing a claim by calling (229)684-7442 or visiting the [employer] website at GoldCloset.com.ee. 7. How can I learn more?  Ask your doctor.  Call your local or state health department.  Contact the Centers for Disease Control and Prevention (CDC):  Call (202)490-8511 or visit CDC's website at http://hunter.com/. CDC Tdap Vaccine VIS (11/18/11) Document Released: 12/28/2011 Document Revised: 11/12/2013 Document Reviewed: 10/10/2013 ExitCare Patient Information 2015 [place], [place]. This information is not intended to replace advice given to you by your health care provider. Make sure you discuss any questions you have with your health care provider.     I personally performed the services described in this documentation, which was scribed in my presence. The recorded information has been reviewed and considered, and addended by me as needed.

## 2014-10-25 NOTE — Patient Instructions (Addendum)
You should receive a call or letter about your lab results within the next week to 10 days.  Let me know if further information needed from your school, and good luck.   Tdap Vaccine (Tetanus, Diphtheria, Pertussis): What You Need to Know 1. Why get vaccinated? Tetanus, diphtheria and pertussis can be very serious diseases, even for adolescents and adults. Tdap vaccine can protect us from these diseases. TETANUS (Lockjaw) causes painful muscle tightening and stiffness, usually all over the body.  It can lead to tightening of muscles in the head and neck so you can't open your mouth, swallow, or sometimes even breathe. Tetanus kills about 1 out of 5 people who are infected. DIPHTHERIA can cause a thick coating to form in the back of the throat.  It can lead to breathing problems, paralysis, heart failure, and death. PERTUSSIS (Whooping Cough) causes severe coughing spells, which can cause difficulty breathing, vomiting and disturbed sleep.  It can also lead to weight loss, incontinence, and rib fractures. Up to 2 in 100 adolescents and 5 in 100 adults with pertussis are hospitalized or have complications, which could include pneumonia or death. These diseases are caused by bacteria. Diphtheria and pertussis are spread from person to person through coughing or sneezing. Tetanus enters the body through cuts, scratches, or wounds. Before vaccines, the [place] saw as many as 200,000 cases a year of diphtheria and pertussis, and hundreds of cases of tetanus. Since vaccination began, tetanus and diphtheria have dropped by about 99% and pertussis by about 80%. 2. Tdap vaccine Tdap vaccine can protect adolescents and adults from tetanus, diphtheria, and pertussis. One dose of Tdap is routinely given at age 26 or 3012. People who did not get Tdap at that age should get it as soon as possible. Tdap is especially important for health care professionals and anyone having close contact with a baby younger  than 12 months. Pregnant women should get a dose of Tdap during every pregnancy, to protect the newborn from pertussis. Infants are most at risk for severe, life-threatening complications from pertussis. A similar vaccine, called Td, protects from tetanus and diphtheria, but not pertussis. A Td booster should be given every 10 years. Tdap may be given as one of these boosters if you have not already gotten a dose. Tdap may also be given after a severe cut or burn to prevent tetanus infection. Your doctor can give you more information. Tdap may safely be given at the same time as other vaccines. 3. Some people should not get this vaccine  If you ever had a life-threatening allergic reaction after a dose of any tetanus, diphtheria, or pertussis containing vaccine, OR if you have a severe allergy to any part of this vaccine, you should not get Tdap. Tell your doctor if you have any severe allergies.  If you had a coma, or long or multiple seizures within 7 days after a childhood dose of DTP or DTaP, you should not get Tdap, unless a cause other than the vaccine was found. You can still get Td.  Talk to your doctor if you:  have epilepsy or another nervous system problem,  had severe pain or swelling after any vaccine containing diphtheria, tetanus or pertussis,  ever had Guillain-Barr Syndrome (GBS),  aren't feeling well on the day the shot is scheduled. 4. Risks of a vaccine reaction With any medicine, including vaccines, there is a chance of side effects. These are usually mild and go away on their own, but serious  reactions are also possible. Brief fainting spells can follow a vaccination, leading to injuries from falling. Sitting or lying down for about 15 minutes can help prevent these. Tell your doctor if you feel dizzy or light-headed, or have vision changes or ringing in the ears. Mild problems following Tdap (Did not interfere with activities)  Pain where the shot was given (about 3  in 4 adolescents or 2 in 3 adults)  Redness or swelling where the shot was given (about 1 person in 5)  Mild fever of at least 100.48F (up to about 1 in 25 adolescents or 1 in 100 adults)  Headache (about 3 or 4 people in 10)  Tiredness (about 1 person in 3 or 4)  Nausea, vomiting, diarrhea, stomach ache (up to 1 in 4 adolescents or 1 in 10 adults)  Chills, body aches, sore joints, rash, swollen glands (uncommon) Moderate problems following Tdap (Interfered with activities, but did not require medical attention)  Pain where the shot was given (about 1 in 5 adolescents or 1 in 100 adults)  Redness or swelling where the shot was given (up to about 1 in 16 adolescents or 1 in 25 adults)  Fever over 102F (about 1 in 100 adolescents or 1 in 250 adults)  Headache (about 3 in 20 adolescents or 1 in 10 adults)  Nausea, vomiting, diarrhea, stomach ache (up to 1 or 3 people in 100)  Swelling of the entire arm where the shot was given (up to about 3 in 100). Severe problems following Tdap (Unable to perform usual activities; required medical attention)  Swelling, severe pain, bleeding and redness in the arm where the shot was given (rare). A severe allergic reaction could occur after any vaccine (estimated less than 1 in a million doses). 5. What if there is a serious reaction? What should I look for?  Look for anything that concerns you, such as signs of a severe allergic reaction, very high fever, or behavior changes. Signs of a severe allergic reaction can include hives, swelling of the face and throat, difficulty breathing, a fast heartbeat, dizziness, and weakness. These would start a few minutes to a few hours after the vaccination. What should I do?  If you think it is a severe allergic reaction or other emergency that can't wait, call 9-1-1 or get the person to the nearest hospital. Otherwise, call your doctor.  Afterward, the reaction should be reported to the "Vaccine Adverse  Event Reporting System" (VAERS). Your doctor might file this report, or you can do it yourself through the VAERS web site at www.vaers.LAgents.no, or by calling 1-506-346-3857. VAERS is only for reporting reactions. They do not give medical advice.  6. The National Vaccine Injury Compensation Program The [employer] Vaccine Injury Compensation Program (VICP) is a federal program that was created to compensate people who may have been injured by certain vaccines. Persons who believe they may have been injured by a vaccine can learn about the program and about filing a claim by calling 1-631-102-3196 or visiting the VICP website at SpiritualWord.at. 7. How can I learn more?  Ask your doctor.  Call your local or state health department.  Contact the Centers for Disease Control and Prevention (CDC):  Call 575-066-0456 or visit CDC's website at PicCapture.uy. CDC Tdap Vaccine VIS (11/18/11) Document Released: 12/28/2011 Document Revised: 11/12/2013 Document Reviewed: 10/10/2013 ExitCare Patient Information 2015 [place], [place]. This information is not intended to replace advice given to you by your health care provider. Make sure you discuss  any questions you have with your health care provider.  

## 2014-10-28 LAB — VARICELLA ZOSTER ANTIBODY, IGG: Varicella IgG: 880 Index — ABNORMAL HIGH (ref <135.00)

## 2014-10-30 ENCOUNTER — Telehealth: Payer: Self-pay | Admitting: Family Medicine

## 2014-10-30 NOTE — Telephone Encounter (Signed)
lmom to call and reschedule appt for earlier on 12/23/14 mrs debbie will be out of the office at 12 that day

## 2014-11-27 ENCOUNTER — Ambulatory Visit (INDEPENDENT_AMBULATORY_CARE_PROVIDER_SITE_OTHER): Payer: Worker's Compensation | Admitting: Family Medicine

## 2014-11-27 VITALS — BP 114/70 | HR 74 | Temp 98.4°F | Resp 18 | Ht 65.0 in | Wt 182.0 lb

## 2014-11-27 DIAGNOSIS — S060X0A Concussion without loss of consciousness, initial encounter: Secondary | ICD-10-CM | POA: Diagnosis not present

## 2014-11-27 DIAGNOSIS — M546 Pain in thoracic spine: Secondary | ICD-10-CM | POA: Diagnosis not present

## 2014-11-27 MED ORDER — METHOCARBAMOL 500 MG PO TABS: 500.0000 mg | ORAL_TABLET | Freq: Three times a day (TID) | ORAL | Status: DC | PRN

## 2014-11-27 NOTE — Progress Notes (Signed)
Anita Leblanc 10-25-1990 23 y.o.   Chief Complaint  Patient presents with  . Head Injury    hit head on cabinet a few weeks ago now having neck, back and head pain  . Neck Pain  . Back Pain     Date of Injury: 11/04/14  History of Present Illness:  Presents for evaluation of work-related complaint. On 11/04/14 she stood up and hit her head on the corner of a cabinet door while at work.  No LOC.  She does not recall feeling sleepy, no memory loss.  She noted onset of sinus pressure at that time as well.   This is her first visit for this problem.   Since her injury she has noted heaches about 3 x a week, and also some brief episodes of "sharp pains" in her head.  She notes tenderness in between her shoulder blades as well.    She tried ibuprofen "once or twice," but otherwise is not using any medications for this  No vomiting No confusion.   She has felt "a little more tired lately" but has not noted any mental slowing.    No previous head injuries.    She is normally in good health    ROS  As per HPI- otherwise negative.   No Known Allergies   Current medications reviewed and updated. Past medical history, family history, social history have been reviewed and updated.   Physical Exam  GEN: WDWN, NAD, Non-toxic, A & O x 3, looks well HEENT: Atraumatic, Normocephalic. Neck supple. No masses, No LAD.  Bilateral TM wnl, oropharynx normal.  PEERL,EOMI.   Neck supple, no cervical spine tenderness No laceration or swelling on the crown of her head (site of contusion) Ears and Nose: No external deformity. CV: RRR, No M/G/R. No JVD. No thrill. No extra heart sounds. PULM: CTA B, no wheezes, crackles, rhonchi. No retractions. No resp. distress. No accessory muscle use. EXTR: No c/c/e NEURO Normal gait. Normal strength, sensation, DTR all extremities,  Normal sensation and movement of face.  Normal tandem gait, negative romberg PSYCH: Normally interactive. Conversant. Not  depressed or anxious appearing.  Calm demeanor.  Not able to reproduce back tenderness with palpation but she states it is located between her shoulder blades    Assessment and Plan: Concussion without loss of consciousness, initial encounter  Bilateral thoracic back pain - Plan: methocarbamol (ROBAXIN) 500 MG tablet  Likely mild concussion with head injury 3 weeks ago followed by intermittent HA.  Her exam is reassuring.  Offered a CT to rule- out unlikely intercranial trauma- she declines at this time OOW, rest

## 2014-11-27 NOTE — Patient Instructions (Signed)
I think that you have a mild concussion- the most important thing to do is to rest!   This means no work, no exercise, no reading/ TV/ computer/ phone.  You can listen to music.  As you start to feel better you can try doing more- if symptoms come back this means you are doing too much and need to back off  Please see us on Monday morning for a recheck Use the robaxin as needed for your back and neck pain- this will make you feel a bit sleepy If you are getting worse please come back in or call

## 2014-12-02 ENCOUNTER — Encounter: Payer: Self-pay | Admitting: Family Medicine

## 2014-12-02 ENCOUNTER — Ambulatory Visit (INDEPENDENT_AMBULATORY_CARE_PROVIDER_SITE_OTHER): Payer: Worker's Compensation | Admitting: Family Medicine

## 2014-12-02 VITALS — BP 102/68 | HR 76 | Temp 98.9°F | Resp 16 | Ht 65.0 in | Wt 181.0 lb

## 2014-12-02 DIAGNOSIS — S060X0D Concussion without loss of consciousness, subsequent encounter: Secondary | ICD-10-CM | POA: Diagnosis not present

## 2014-12-02 NOTE — Patient Instructions (Signed)
You can go back to full duty.   However if you have any return of symptoms such as headache, excessive fatigue, nausea, etc please come back and see us right away

## 2014-12-02 NOTE — Progress Notes (Signed)
Anita Leblanc December 13, 1990 23 y.o.   Chief Complaint  Patient presents with  . Follow-up  . Concussion     Date of Injury: 11/04/14  History of Present Illness:  Presents for evaluation of work-related complaint. On 4/25 she stood up and hit her head on an open cabinet.  She had then noted headaches, some sharp pains in her head and tenderness in her upper back, mild fatigue.  Seen her eon 5/18 for her first visit for this injury,  Kept OOW, treated with robaxin.   She feels that she is improved. Her next and back pain are resolved.  She noted a mild HA a couple of days ago but nothing now,  She was able to do some activities at home   She works 8 hours a day, 5 days a week   ROS    No Known Allergies   Current medications reviewed and updated. Past medical history, family history, social history have been reviewed and updated.   Physical Exam  GEN: WDWN, NAD, Non-toxic, A & O x 3 HEENT: Atraumatic, Normocephalic. Neck supple. No masses, No LAD. Ears and Nose: No external deformity. CV: RRR, No M/G/R. No JVD. No thrill. No extra heart sounds. PULM: CTA B, no wheezes, crackles, rhonchi. No retractions. No resp. distress. No accessory muscle use. ABD: S, NT, ND, +BS. No rebound. No HSM. EXTR: No c/c/e NEURO Normal gait. Normal strength, sensation and romberg.  Normally symmetric but reduced DTR in al extremities- baseline for pt PSYCH: Normally interactive. Conversant. Not depressed or anxious appearing.  Calm demeanor.   Assessment and Plan: Concussion, without loss of consciousness, subsequent encounter  Sx currently resolved.  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  She works M-F, 8 hour days.  Cleared to RTW but cautioned to recheck if any sx return

## 2014-12-23 ENCOUNTER — Encounter: Payer: Self-pay | Admitting: Family Medicine

## 2015-01-06 ENCOUNTER — Ambulatory Visit (INDEPENDENT_AMBULATORY_CARE_PROVIDER_SITE_OTHER): Payer: 59 | Admitting: Physician Assistant

## 2015-01-06 VITALS — BP 116/80 | HR 82 | Temp 98.6°F | Resp 18 | Ht 65.0 in | Wt 184.0 lb

## 2015-01-06 DIAGNOSIS — N390 Urinary tract infection, site not specified: Secondary | ICD-10-CM | POA: Diagnosis not present

## 2015-01-06 DIAGNOSIS — R51 Headache: Secondary | ICD-10-CM

## 2015-01-06 DIAGNOSIS — M67431 Ganglion, right wrist: Secondary | ICD-10-CM

## 2015-01-06 DIAGNOSIS — R519 Headache, unspecified: Secondary | ICD-10-CM

## 2015-01-06 DIAGNOSIS — Z0000 Encounter for general adult medical examination without abnormal findings: Secondary | ICD-10-CM | POA: Diagnosis not present

## 2015-01-06 DIAGNOSIS — R700 Elevated erythrocyte sedimentation rate: Secondary | ICD-10-CM | POA: Diagnosis not present

## 2015-01-06 DIAGNOSIS — R8271 Bacteriuria: Secondary | ICD-10-CM

## 2015-01-06 DIAGNOSIS — Z130 Encounter for screening for diseases of the blood and blood-forming organs and certain disorders involving the immune mechanism: Secondary | ICD-10-CM

## 2015-01-06 DIAGNOSIS — Z1389 Encounter for screening for other disorder: Secondary | ICD-10-CM

## 2015-01-06 LAB — POCT UA - MICROSCOPIC ONLY
CRYSTALS, UR, HPF, POC: NEGATIVE
Casts, Ur, LPF, POC: NEGATIVE
Mucus, UA: NEGATIVE
YEAST UA: NEGATIVE

## 2015-01-06 LAB — POCT URINALYSIS DIPSTICK
Bilirubin, UA: NEGATIVE
Blood, UA: NEGATIVE
Glucose, UA: NEGATIVE
KETONES UA: NEGATIVE
LEUKOCYTES UA: NEGATIVE
Nitrite, UA: POSITIVE
PH UA: 7
PROTEIN UA: NEGATIVE
Spec Grav, UA: 1.02
Urobilinogen, UA: 0.2

## 2015-01-06 NOTE — Progress Notes (Signed)
Subjective:    Patient ID: Anita Leblanc, female    DOB: 1990-11-06, 24 y.o.   MRN: [id number]  Chief Complaint  Patient presents with  . Annual Exam    w/o pelvic exam   There are no active problems to display for this patient.  Prior to Admission medications   Medication Sig Start Date End Date Taking? Authorizing Provider  Ascorbic Acid (VITAMIN C PO) Take 1 tablet by mouth daily.   Yes Historical Provider, MD  Multiple Vitamins-Minerals (MULTIVITAMIN WITH MINERALS) tablet Take 1 tablet by mouth daily.   Yes Historical Provider, MD  norgestimate-ethinyl estradiol (ORTHO-CYCLEN,SPRINTEC,PREVIFEM) 0.25-35 MG-MCG tablet Take 1 tablet by mouth daily.    Historical Provider, MD   Medications, allergies, past medical history, surgical history, family history, social history and problem list reviewed and updated.  HPI  45 yof presents for cpe.   She is working at a childcare center. 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 She wanted the cpe prior to leaving for this. She is excited about leaving.   Exercise: Cardio 3-4 days week.  Diet: None specific. 1-2 sodas day.  Immunizations: utd had tdap 2 months ago Gyn: Sees Dr Mitzi Hansen. Had normal pap and breast exam with them 3/16.  She had cpe 2 yrs ago with normal cbc, cmp, tsh, lipids, ua  She denies any issues or complaints other than HAs.  HAs: Intermittent past 2 months (of note she had concussion one month ago but HAs started prior to this). HAs located behind eyes bilaterally. Last 30 secs and resolve on own. No aggravating or relieving factors. No assoc vision change or unilateral weakness, numbness. Has them approx 2-3 x week. Sometimes feels mildly dizzy with them.   Review of Systems Negative per ROS sheet.     Objective:   Physical Exam  Constitutional: She is oriented to person, place, and time. She appears well-developed and well-nourished.  Non-toxic appearance. She does not have  a sickly appearance. She does not appear ill. No distress.  BP 116/80 mmHg  Pulse 82  Temp(Src) 98.6 F (37 C) (Oral)  Resp 18  Ht  (1.651 m)  Wt 184 lb (83.462 kg)  BMI 30.62 kg/m2  SpO2 96%  LMP 12/09/2014   HENT:  Right Ear: Tympanic membrane normal.  Left Ear: Tympanic membrane normal.  Mouth/Throat: Uvula is midline, oropharynx is clear and moist and mucous membranes are normal.  Eyes: Conjunctivae and EOM are normal. Pupils are equal, round, and reactive to light.  Neck: Normal range of motion. No thyroid mass and no thyromegaly present.  Cardiovascular: Normal rate, regular rhythm and normal heart sounds.   Pulmonary/Chest: Effort normal and breath sounds normal. No tachypnea.  Abdominal: Soft. Normal appearance and bowel sounds are normal. There is no tenderness. There is no CVA tenderness.  Musculoskeletal: Normal range of motion.  Small 5mm firm round cyst right dorsal wrist. No ttp. No overlying skin changes.   Neurological: She is alert and oriented to person, place, and time. She has normal strength. No cranial nerve deficit or sensory deficit. She displays a negative Romberg sign.  Normal rapid alternating movements. Normal heel to shin.   Psychiatric: She has a normal mood and affect. Her speech is normal and behavior is normal.   Results for orders placed or performed in visit on 01/06/15  POCT urinalysis dipstick  Result Value Ref Range   Color, UA yellow  Clarity, UA clear    Glucose, UA neg    Bilirubin, UA neg    Ketones, UA neg    Spec Grav, UA 1.020    Blood, UA neg    pH, UA 7.0    Protein, UA neg    Urobilinogen, UA 0.2    Nitrite, UA positive    Leukocytes, UA Negative Negative  POCT UA - Microscopic Only  Result Value Ref Range   WBC, Ur, HPF, POC 1-3    RBC, urine, microscopic 0-1    Bacteria, U Microscopic large    Mucus, UA neg    Epithelial cells, urine per micros 1-3    Crystals, Ur, HPF, POC neg    Casts, Ur, LPF, POC neg     Yeast, UA neg       Assessment & Plan:   41 yof presents for cpe.   Annual physical exam --normal exam, vitals, ros --rtc as needed  Screening for deficiency anemia - Plan: CBC Screening for nephropathy - Plan: POCT urinalysis dipstick, POCT UA - Microscopic Only, Comprehensive metabolic panel Elevated sed rate - Plan: Sedimentation Rate --repeat sed rate as was elevated 2 yrs ago, was attributed to lung issue at that time  Frequent headaches - Plan: Ambulatory referral to Neurology --unsure of etiology --normal neuro exam today  Ganglion cyst of wrist, right --discussed options of wait and see, aspiration today, or referral to hand --not bothersome for pt, she would like to wait and let us know when she would like referral to hand  Asymptomatic bacteriuria --bacteria noted on ua, pt completely asx --no tx at this time  Donnajean Lopes, PA-C Physician Assistant-Certified [facility]  01/07/2015 8:37 AM

## 2015-01-06 NOTE — Patient Instructions (Signed)
Your exam was normal today.  We have referred you to neurology for your frequent headaches.  We are checking labs and urine today and will be in touch with you with those results.  Let us know if you'd like [place] to aspirate the ganglion cyst or if you'd like [place] to refer you to hand.   Health Maintenance Adopting a healthy lifestyle and getting preventive care can go a long way to promote health and wellness. Talk with your health care provider about what schedule of regular examinations is right for you. This is a good chance for you to check in with your provider about disease prevention and staying healthy. In between checkups, there are plenty of things you can do on your own. Experts have done a lot of research about which lifestyle changes and preventive measures are most likely to keep you healthy. Ask your health care provider for more information. WEIGHT AND DIET  Eat a healthy diet  Be sure to include plenty of vegetables, fruits, low-fat dairy products, and lean protein.  Do not eat a lot of foods high in solid fats, added sugars, or salt.  Get regular exercise. This is one of the most important things you can do for your health.  Most adults should exercise for at least 150 minutes each week. The exercise should increase your heart rate and make you sweat (moderate-intensity exercise).  Most adults should also do strengthening exercises at least twice a week. This is in addition to the moderate-intensity exercise.  Maintain a healthy weight  Body mass index (BMI) is a measurement that can be used to identify possible weight problems. It estimates body fat based on height and weight. Your health care provider can help determine your BMI and help you achieve or maintain a healthy weight.  For females 78 years of age and older:   A BMI below 18.5 is considered underweight.  A BMI of 18.5 to 24.9 is normal.  A BMI of 25 to 29.9 is considered overweight.  A BMI of 30 and above  is considered obese.  Watch levels of cholesterol and blood lipids  You should start having your blood tested for lipids and cholesterol at 26 years of age, then have this test every 5 years.  You may need to have your cholesterol levels checked more often if:  Your lipid or cholesterol levels are high.  You are older than 26 years of age.  You are at high risk for heart disease.  CANCER SCREENING   Lung Cancer  Lung cancer screening is recommended for adults 26-4 years old who are at high risk for lung cancer because of a history of smoking.  A yearly low-dose CT scan of the lungs is recommended for people who:  Currently smoke.  Have quit within the past 15 years.  Have at least a 30-pack-year history of smoking. A pack year is smoking an average of one pack of cigarettes a day for 1 year.  Yearly screening should continue until it has been 15 years since you quit.  Yearly screening should stop if you develop a health problem that would prevent you from having lung cancer treatment.  Breast Cancer  Practice breast self-awareness. This means understanding how your breasts normally appear and feel.  It also means doing regular breast self-exams. Let your health care provider know about any changes, no matter how small.  If you are in your 20s or 30s, you should have a clinical breast exam (CBE)  by a health care provider every 1-3 years as part of a regular health exam.  If you are 40 or older, have a CBE every year. Also consider having a breast X-ray (mammogram) every year.  If you have a family history of breast cancer, talk to your health care provider about genetic screening.  If you are at high risk for breast cancer, talk to your health care provider about having an MRI and a mammogram every year.  Breast cancer gene (BRCA) assessment is recommended for women who have family members with BRCA-related cancers. BRCA-related cancers  include:  Breast.  Ovarian.  Tubal.  Peritoneal cancers.  Results of the assessment will determine the need for genetic counseling and BRCA1 and BRCA2 testing. Cervical Cancer Routine pelvic examinations to screen for cervical cancer are no longer recommended for nonpregnant women who are considered low risk for cancer of the pelvic organs (ovaries, uterus, and vagina) and who do not have symptoms. A pelvic examination may be necessary if you have symptoms including those associated with pelvic infections. Ask your health care provider if a screening pelvic exam is right for you.   The Pap test is the screening test for cervical cancer for women who are considered at risk.  If you had a hysterectomy for a problem that was not cancer or a condition that could lead to cancer, then you no longer need Pap tests.  If you are older than 65 years, and you have had normal Pap tests for the past 10 years, you no longer need to have Pap tests.  If you have had past treatment for cervical cancer or a condition that could lead to cancer, you need Pap tests and screening for cancer for at least 20 years after your treatment.  If you no longer get a Pap test, assess your risk factors if they change (such as having a new sexual partner). This can affect whether you should start being screened again.  Some women have medical problems that increase their chance of getting cervical cancer. If this is the case for you, your health care provider may recommend more frequent screening and Pap tests.  The human papillomavirus (HPV) test is another test that may be used for cervical cancer screening. The HPV test looks for the virus that can cause cell changes in the cervix. The cells collected during the Pap test can be tested for HPV.  The HPV test can be used to screen women 30 years of age and older. Getting tested for HPV can extend the interval between normal Pap tests from three to five years.  An HPV  test also should be used to screen women of any age who have unclear Pap test results.  After 26 years of age, women should have HPV testing as often as Pap tests.  Colorectal Cancer  This type of cancer can be detected and often prevented.  Routine colorectal cancer screening usually begins at 26 years of age and continues through 26 years of age.  Your health care provider may recommend screening at an earlier age if you have risk factors for colon cancer.  Your health care provider may also recommend using home test kits to check for hidden blood in the stool.  A small camera at the end of a tube can be used to examine your colon directly (sigmoidoscopy or colonoscopy). This is done to check for the earliest forms of colorectal cancer.  Routine screening usually begins at age 50.  Direct   examination of the colon should be repeated every 5-10 years through 26 years of age. However, you may need to be screened more often if early forms of precancerous polyps or small growths are found. Skin Cancer  Check your skin from head to toe regularly.  Tell your health care provider about any new moles or changes in moles, especially if there is a change in a mole's shape or color.  Also tell your health care provider if you have a mole that is larger than the size of a pencil eraser.  Always use sunscreen. Apply sunscreen liberally and repeatedly throughout the day.  Protect yourself by wearing long sleeves, pants, a wide-brimmed hat, and sunglasses whenever you are outside. HEART DISEASE, DIABETES, AND HIGH BLOOD PRESSURE   Have your blood pressure checked at least every 1-2 years. High blood pressure causes heart disease and increases the risk of stroke.  If you are between 55 years and 79 years old, ask your health care provider if you should take aspirin to prevent strokes.  Have regular diabetes screenings. This involves taking a blood sample to check your fasting blood sugar  level.  If you are at a normal weight and have a low risk for diabetes, have this test once every three years after 26 years of age.  If you are overweight and have a high risk for diabetes, consider being tested at a younger age or more often. PREVENTING INFECTION  Hepatitis B  If you have a higher risk for hepatitis B, you should be screened for this virus. You are considered at high risk for hepatitis B if:  You were born in a country where hepatitis B is common. Ask your health care provider which countries are considered high risk.  Your parents were born in a high-risk country, and you have not been immunized against hepatitis B (hepatitis B vaccine).  You have HIV or AIDS.  You use needles to inject street drugs.  You live with someone who has hepatitis B.  You have had sex with someone who has hepatitis B.  You get hemodialysis treatment.  You take certain medicines for conditions, including cancer, organ transplantation, and autoimmune conditions. Hepatitis C  Blood testing is recommended for:  Everyone born from 1945 through 1965.  Anyone with known risk factors for hepatitis C. Sexually transmitted infections (STIs)  You should be screened for sexually transmitted infections (STIs) including gonorrhea and chlamydia if:  You are sexually active and are younger than 26 years of age.  You are older than 26 years of age and your health care provider tells you that you are at risk for this type of infection.  Your sexual activity has changed since you were last screened and you are at an increased risk for chlamydia or gonorrhea. Ask your health care provider if you are at risk.  If you do not have HIV, but are at risk, it may be recommended that you take a prescription medicine daily to prevent HIV infection. This is called pre-exposure prophylaxis (PrEP). You are considered at risk if:  You are sexually active and do not regularly use condoms or know the HIV status  of your partner(s).  You take drugs by injection.  You are sexually active with a partner who has HIV. Talk with your health care provider about whether you are at high risk of being infected with HIV. If you choose to begin PrEP, you should first be tested for HIV. You should then be tested   every 3 months for as long as you are taking PrEP.  PREGNANCY   If you are premenopausal and you may become pregnant, ask your health care provider about preconception counseling.  If you may become pregnant, take 400 to 800 micrograms (mcg) of folic acid every day.  If you want to prevent pregnancy, talk to your health care provider about birth control (contraception). OSTEOPOROSIS AND MENOPAUSE   Osteoporosis is a disease in which the bones lose minerals and strength with aging. This can result in serious bone fractures. Your risk for osteoporosis can be identified using a bone density scan.  If you are 65 years of age or older, or if you are at risk for osteoporosis and fractures, ask your health care provider if you should be screened.  Ask your health care provider whether you should take a calcium or vitamin D supplement to lower your risk for osteoporosis.  Menopause may have certain physical symptoms and risks.  Hormone replacement therapy may reduce some of these symptoms and risks. Talk to your health care provider about whether hormone replacement therapy is right for you.  HOME CARE INSTRUCTIONS   Schedule regular health, dental, and eye exams.  Stay current with your immunizations.   Do not use any tobacco products including cigarettes, chewing tobacco, or electronic cigarettes.  If you are pregnant, do not drink alcohol.  If you are breastfeeding, limit how much and how often you drink alcohol.  Limit alcohol intake to no more than 1 drink per day for nonpregnant women. One drink equals 12 ounces of beer, 5 ounces of wine, or 1 ounces of hard liquor.  Do not use street  drugs.  Do not share needles.  Ask your health care provider for help if you need support or information about quitting drugs.  Tell your health care provider if you often feel depressed.  Tell your health care provider if you have ever been abused or do not feel safe at home. Document Released: 01/11/2011 Document Revised: 11/12/2013 Document Reviewed: 05/30/2013 [employer] Patient Information 2015 [employer]. This information is not intended to replace advice given to you by your health care provider. Make sure you discuss any questions you have with your health care provider.  

## 2015-01-07 LAB — COMPREHENSIVE METABOLIC PANEL
ALT: 12 U/L (ref 0–35)
AST: 20 U/L (ref 0–37)
Albumin: 4.2 g/dL (ref 3.5–5.2)
Alkaline Phosphatase: 55 U/L (ref 39–117)
BUN: 17 mg/dL (ref 6–23)
CHLORIDE: 104 meq/L (ref 96–112)
CO2: 23 mEq/L (ref 19–32)
CREATININE: 0.76 mg/dL (ref 0.50–1.10)
Calcium: 9.6 mg/dL (ref 8.4–10.5)
Glucose, Bld: 79 mg/dL (ref 70–99)
Potassium: 4 mEq/L (ref 3.5–5.3)
SODIUM: 138 meq/L (ref 135–145)
TOTAL PROTEIN: 7.8 g/dL (ref 6.0–8.3)
Total Bilirubin: 0.4 mg/dL (ref 0.2–1.2)

## 2015-01-07 LAB — CBC
HCT: 37.7 % (ref 36.0–46.0)
HEMOGLOBIN: 12.3 g/dL (ref 12.0–15.0)
MCH: 29.3 pg (ref 26.0–34.0)
MCHC: 32.6 g/dL (ref 30.0–36.0)
MCV: 89.8 fL (ref 78.0–100.0)
MPV: 11.6 fL (ref 8.6–12.4)
Platelets: 246 10*3/uL (ref 150–400)
RBC: 4.2 MIL/uL (ref 3.87–5.11)
RDW: 13.5 % (ref 11.5–15.5)
WBC: 4.7 10*3/uL (ref 4.0–10.5)

## 2015-01-07 LAB — SEDIMENTATION RATE: Sed Rate: 38 mm/hr — ABNORMAL HIGH (ref 0–20)

## 2015-01-14 ENCOUNTER — Telehealth: Payer: Self-pay | Admitting: Physician Assistant

## 2015-01-14 DIAGNOSIS — IMO0001 Reserved for inherently not codable concepts without codable children: Secondary | ICD-10-CM

## 2015-01-14 NOTE — Telephone Encounter (Signed)
Called and spoke with pt. CBC, CMP normal. Instructed her esr still elevated but improved from 2 yrs ago. Would like to add crp to panel. Will place future order, she is agreeable to come in over next few days for lab draw. Will f/u on result.

## 2015-01-15 ENCOUNTER — Other Ambulatory Visit (INDEPENDENT_AMBULATORY_CARE_PROVIDER_SITE_OTHER): Payer: 59

## 2015-01-15 ENCOUNTER — Other Ambulatory Visit: Payer: Self-pay | Admitting: Physician Assistant

## 2015-01-15 DIAGNOSIS — IMO0001 Reserved for inherently not codable concepts without codable children: Secondary | ICD-10-CM

## 2015-01-15 DIAGNOSIS — Q998 Other specified chromosome abnormalities: Secondary | ICD-10-CM | POA: Diagnosis not present

## 2015-01-15 LAB — C-REACTIVE PROTEIN: CRP: <0.5 mg/dL (ref <0.60)

## 2015-01-16 ENCOUNTER — Other Ambulatory Visit: Payer: Self-pay

## 2015-01-16 ENCOUNTER — Telehealth: Payer: Self-pay

## 2015-01-16 DIAGNOSIS — R700 Elevated erythrocyte sedimentation rate: Secondary | ICD-10-CM

## 2015-01-16 NOTE — Telephone Encounter (Signed)
Called [facility] and added on ANA per [facility].

## 2015-01-17 LAB — ANA: Anti Nuclear Antibody(ANA): NEGATIVE

## 2015-01-20 ENCOUNTER — Telehealth: Payer: Self-pay | Admitting: Physician Assistant

## 2015-01-20 NOTE — Telephone Encounter (Signed)
Called and discussed labs with pt. Has hx elevated esr. Drew crp and ana which were both normal. Informed pt of reassuring labs.

## 2015-01-25 IMAGING — CR DG CHEST 2V
2 series · 2 of 2 positions shown · non-contrast
Comparison: June 27, 2009

CLINICAL DATA: Chest pain

EXAM:
CHEST  2 VIEW

[w chest pa]
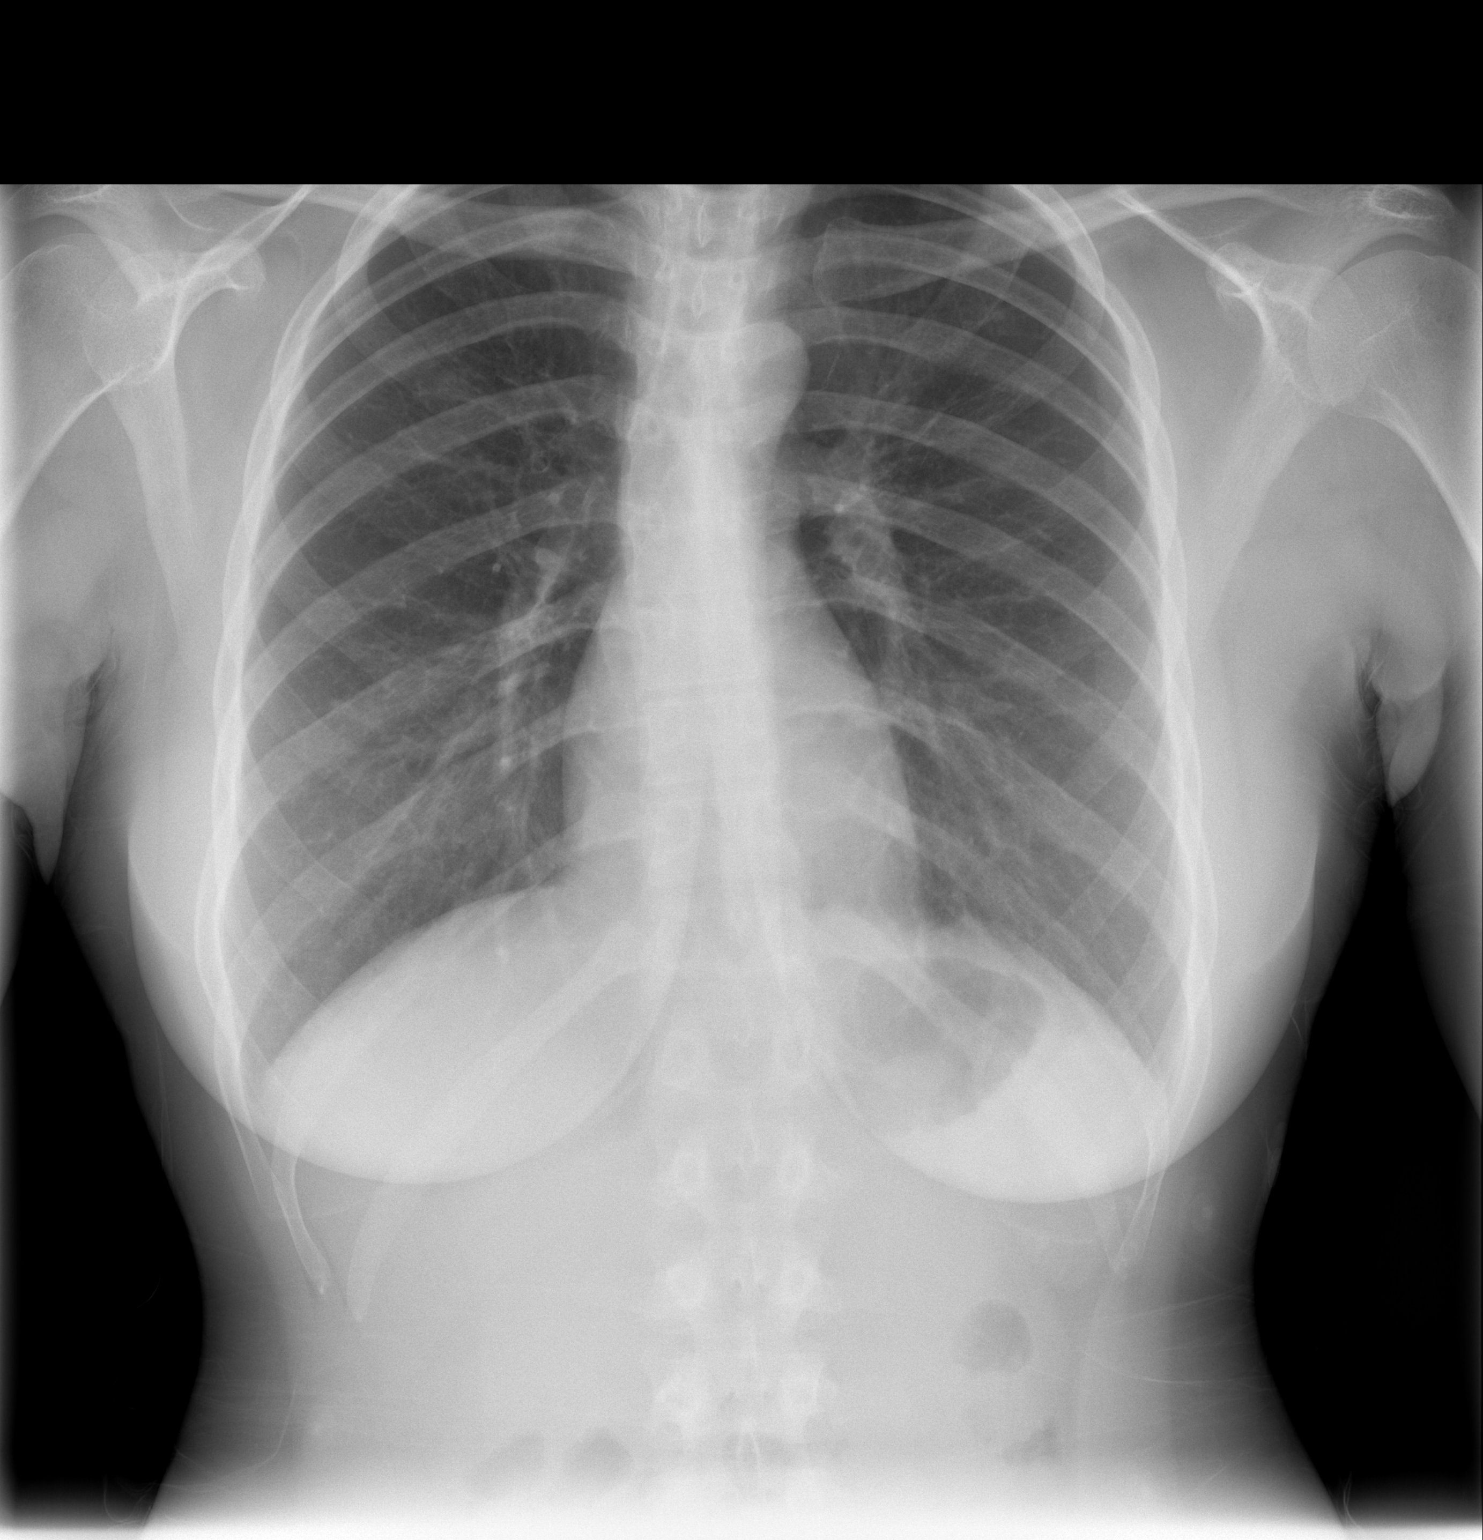

[w chest lat]
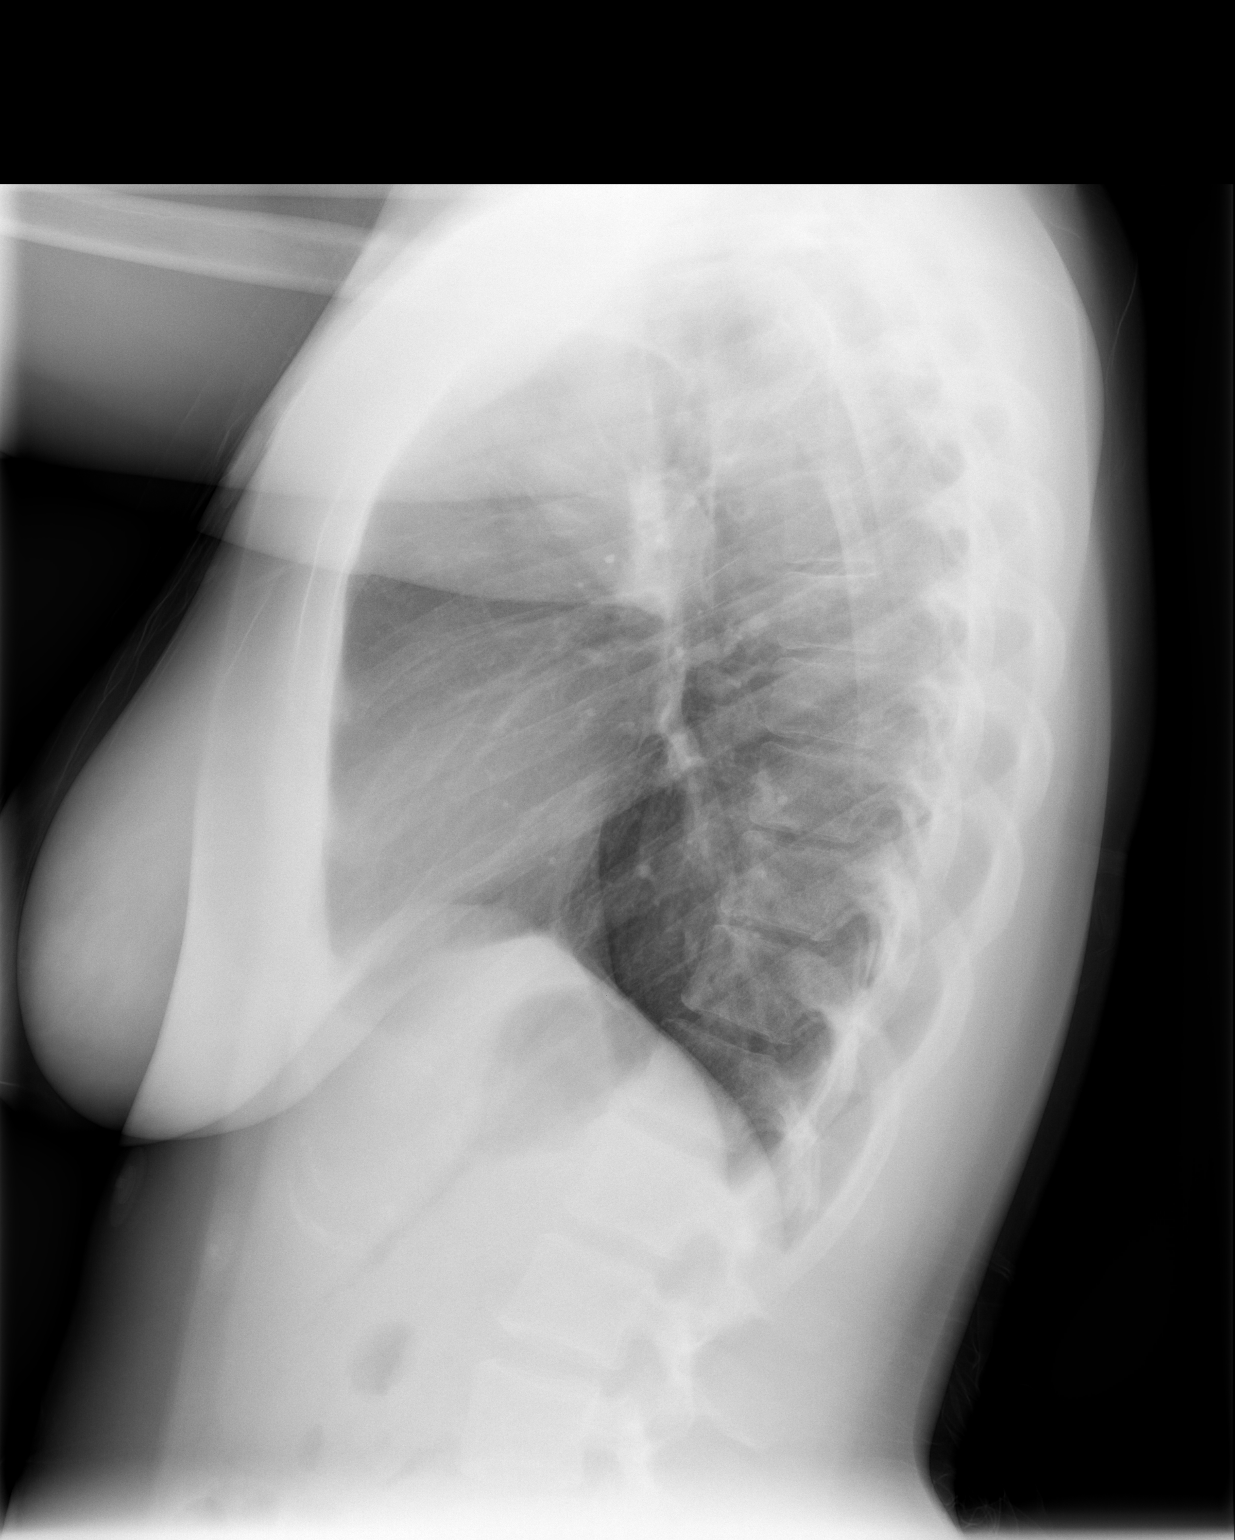

[2 of 2 positions shown; findings below may reference images not displayed]

FINDINGS: There is no focal infiltrate, pulmonary edema, or pleural effusion.
The mediastinal contour and cardiac silhouette are normal. The soft
tissues and osseous structures are stable.
IMPRESSION: No active cardiopulmonary disease.

## 2015-01-31 ENCOUNTER — Ambulatory Visit (INDEPENDENT_AMBULATORY_CARE_PROVIDER_SITE_OTHER): Payer: 59 | Admitting: Neurology

## 2015-01-31 ENCOUNTER — Encounter: Payer: Self-pay | Admitting: Neurology

## 2015-01-31 VITALS — BP 104/78 | HR 64 | Resp 14 | Ht 65.0 in | Wt 190.0 lb

## 2015-01-31 DIAGNOSIS — R700 Elevated erythrocyte sedimentation rate: Secondary | ICD-10-CM

## 2015-01-31 DIAGNOSIS — G501 Atypical facial pain: Secondary | ICD-10-CM | POA: Diagnosis not present

## 2015-01-31 DIAGNOSIS — G4489 Other headache syndrome: Secondary | ICD-10-CM | POA: Diagnosis not present

## 2015-01-31 NOTE — Progress Notes (Signed)
[facility]  PATIENT: Anita Leblanc DOB: 01-10-1991  REFERRING DOCTOR OR PCP:  Ruben Reason   SOURCE: patient  _________________________________   HISTORICAL  CHIEF COMPLAINT:  Chief Complaint  Patient presents with  . Headaches    Sts. onset of h/a's in February. Sts. she remembers with the first h/a, she bent down to pick keys up off of the floor, and had sudden h/a when she stood back up. Sts. temperature changes also precipitate h/a.  Sts. h/a's are very sudden, intense, pounding in nature, are very brief, lasting a minute or less.  She also would like it noted that in April she was at work, stood up and hit the top of her head on a cabinet.  No loc, no lacerations.  She was seen at urgent care and sts. was told she had a   . Head Injury    concussion.  Sts. she does not feel she has any residual effects from that episode./fim    HISTORY OF PRESENT ILLNESS:  I had the pleasure seeing you patient, [name], at [facility] for a neurologic consultation regarding her headaches.  She began to experience brief headaches starting in February 2016. She recalls that with the first headache she bent down to pick up her keys. When she stood back up she had the sudden onset of pounding pain bilaterally near the eyes. The episode lasted about 30 seconds. Since then, she continues to have similar episodes, usually about twice a week. They are often triggered by temperature changes going from hot to cold or from cold to hot. They can also be triggered if she rolls over quickly in bed. When the headache is present, moving her head quickly may somewhat worsen the pain. She has not noted anything that makes it better. It resolves on its own after about 30 seconds. She does not get nausea or vomiting. There is no photophobia or phonophobia. She does not note neck pain or symptoms elsewhere in her body.  Because of the brief nature of the pain, she has not taken  any medications.   After a headache, there is a duller sensation for a minute or so.     A couple months later, in April, she hit her head at work on [place]. There was no loss of consciousness. She was seen at urgent care and was told that she had a mild concussion.  She does not note any residual effects. However, the brief pounding headaches may be mildly more frequent since that episode.  I reviewed notes from her primary care practice as well as laboratory tests. Her ESR was elevated last month at 38 but a follow-up CRP was normal.  REVIEW OF SYSTEMS: Constitutional: No fevers, chills, sweats, or change in appetite.  Denies insomnia Eyes: No visual changes, double vision, eye pain Ear, nose and throat: No hearing loss, ear pain, nasal congestion, sore throat Cardiovascular: No chest pain, palpitations Respiratory: No shortness of breath at rest or with exertion.   No wheezes GastrointestinaI: No nausea, vomiting, diarrhea, abdominal pain, fecal incontinence Genitourinary: No dysuria, urinary retention or frequency.  No nocturia. Musculoskeletal: No neck pain, back pain Integumentary: No rash, pruritus, skin lesions Neurological: as above Psychiatric: No depression at this time.  Reports mild anxiety unrelated to headaches.   Endocrine: No palpitations, diaphoresis, change in appetite, change in weigh or increased thirst Hematologic/Lymphatic: No anemia, purpura, petechiae. Allergic/Immunologic: No itchy/runny eyes, nasal congestion, recent allergic reactions, rashes  ALLERGIES:  No Known Allergies  HOME MEDICATIONS:  Current outpatient prescriptions:  .  Ascorbic Acid (VITAMIN C PO), Take 1 tablet by mouth daily., Disp: , Rfl:  .  Multiple Vitamins-Minerals (MULTIVITAMIN WITH MINERALS) tablet, Take 1 tablet by mouth daily., Disp: , Rfl:  .  norgestimate-ethinyl estradiol (ORTHO-CYCLEN,SPRINTEC,PREVIFEM) 0.25-35 MG-MCG tablet, Take 1 tablet by mouth daily., Disp: , Rfl:   PAST  MEDICAL HISTORY: Past Medical History  Diagnosis Date  . Headache     PAST SURGICAL HISTORY: History reviewed. No pertinent past surgical history.  FAMILY HISTORY: Family History  Problem Relation Age of Onset  . HIV/AIDS Father   . Dementia Father   . Asthma Sister   . Hypertension Maternal Grandmother   . Healthy Mother   . Healthy Brother     SOCIAL HISTORY:  History   Social History  . Marital Status: Single    Spouse Name: N/A  . Number of Children: N/A  . Years of Education: N/A   Occupational History  . Not on file.   Social History Main Topics  . Smoking status: Never Smoker   . Smokeless tobacco: Not on file  . Alcohol Use: 0.0 oz/week    0 Standard drinks or equivalent per week     Comment: drinks occasionally  . Drug Use: No  . Sexual Activity: No   Other Topics Concern  . Not on file   Social History Narrative     PHYSICAL EXAM  Filed Vitals:   01/31/15 0957  BP: 104/78  Pulse: 64  Resp: 14  Height: '5\' 5"'  (1.651 m)  Weight: 190 lb (86.183 kg)    Body mass index is 31.62 kg/(m^2).   General: The patient is well-developed and well-nourished and in no acute distress  Eyes:  Funduscopic exam shows normal optic discs and retinal vessels.  Neck: The neck is supple, no carotid bruits are noted.  The neck is nontender.  Cardiovascular: The heart has a regular rate and rhythm with a normal S1 and S2. There were no murmurs, gallops or rubs.    Skin: Extremities are without significant edema.   Neurologic Exam  Mental status: The patient is alert and oriented x 3 at the time of the examination. The patient has apparent normal recent and remote memory, with an apparently normal attention span and concentration ability.   Speech is normal.  Cranial nerves: Extraocular movements are full. Pupils are equal, round, and reactive to light and accomodation.   Facial symmetry is present. There is good facial sensation to soft touch  bilaterally.Facial strength is normal.  Trapezius and sternocleidomastoid strength is normal. No dysarthria is noted.  The tongue is midline, and the patient has symmetric elevation of the soft palate. No obvious hearing deficits are noted.  Motor:  Muscle bulk is normal.   Tone is normal. Strength is  5 / 5 in all 4 extremities.   Sensory: Sensory testing is intact to touch and vibration sensation in all 4 extremities.  Coordination: Cerebellar testing reveals good finger-nose-finger bilaterally.  Gait and station: Station is normal.   Gait is normal. Tandem gait is normal.    Reflexes: Deep tendon reflexes are symmetric and normal bilaterally.      DIAGNOSTIC DATA (LABS, IMAGING, TESTING) - I reviewed patient records, labs, notes, testing and imaging myself where available.  Lab Results  Component Value Date   WBC 4.7 01/06/2015   HGB 12.3 01/06/2015   HCT 37.7 01/06/2015   MCV 89.8 01/06/2015  PLT 246 01/06/2015      Component Value Date/Time   NA 138 01/06/2015 1654   K 4.0 01/06/2015 1654   CL 104 01/06/2015 1654   CO2 23 01/06/2015 1654   GLUCOSE 79 01/06/2015 1654   BUN 17 01/06/2015 1654   CREATININE 0.76 01/06/2015 1654   CREATININE 0.87 04/05/2013 1136   CALCIUM 9.6 01/06/2015 1654   PROT 7.8 01/06/2015 1654   ALBUMIN 4.2 01/06/2015 1654   AST 20 01/06/2015 1654   ALT 12 01/06/2015 1654   ALKPHOS 55 01/06/2015 1654   BILITOT 0.4 01/06/2015 1654   GFRNONAA >90 04/05/2013 1136   GFRAA >90 04/05/2013 1136   Lab Results  Component Value Date   CHOL 154 11/30/2013   HDL 56 11/30/2013   LDLCALC 88 11/30/2013   TRIG 50 11/30/2013   CHOLHDL 2.8 11/30/2013      ASSESSMENT AND PLAN  Atypical face pain - Plan: MR Brain W Wo Contrast, Sedimentation rate  Other headache syndrome - Plan: MR Brain W Wo Contrast, Sedimentation rate  Elevated sed rate - Plan: MR Brain W Wo Contrast, Sedimentation rate   In summary, Tannia Contino is a 24 year old woman with  atypical facial pain and headaches. She also had an elevated sedimentation rate last month but a normal CRP. Her physical examination is normal. The etiology of her headaches is not apparent. To repeat the sedimentation rate to make sure that it is not increasing further, implying an inflammatory etiology. Additionally, as the headaches have persisted and have actually increased in frequency, we need to check an MRI of the brain possible inflammatory or structural causes of headache.  She will return to see me in 2 or 3 months for a regularly scheduled visit where she will be reevaluated. However, she is advised to call us sooner if the headaches become more frequent or if they change in character or if she has other new or worsening neurologic symptoms.  Thank you for asking me to see Ms. Pilar. Please let me know if I can be of further assistance with her or other patients in the future.   Richard A. Felecia Shelling, MD, PhD 12/15/7701, 40:35 AM Certified in Neurology, Clinical Neurophysiology, Sleep Medicine, Pain Medicine and Neuroimaging  [facility] [address], [place], [place] [address] [phone]

## 2015-02-01 LAB — SEDIMENTATION RATE: Sed Rate: 35 mm/hr — ABNORMAL HIGH (ref 0–32)

## 2015-02-13 ENCOUNTER — Telehealth: Payer: Self-pay | Admitting: Neurology

## 2015-02-17 ENCOUNTER — Telehealth: Payer: Self-pay | Admitting: *Deleted

## 2015-02-17 NOTE — Telephone Encounter (Signed)
I have tried to call Anita Leblanc back, but there was no answer and vm is full.   I was out of the office last week, and see no indication that anyone tried to call her.  She has a f/u scheduled in October, but nothing sooner so she should not have gotten a reminder call for an appt.  I will be happy to speak with her if she calls back/fim

## 2015-02-17 NOTE — Telephone Encounter (Signed)
Pt is returning your call

## 2015-02-17 NOTE — Telephone Encounter (Signed)
-----   Message from Hillis Range, RN sent at 02/17/2015  8:21 AM EDT -----   ----- Message -----    From: Asa Lente, MD    Sent: 02/12/2015   9:04 AM      To: Hillis Range, RN  Please let her know that the sedimentation rate was just very slightly high and better than last month.    When she gets the MRI we'll let her know what the results are.

## 2015-02-17 NOTE — Telephone Encounter (Signed)
I have spoken with Anita Leblanc this morning and per RAS, advised her that esr is slightly high, but is lower than it was last month.  She verbalized understanding of same/fim

## 2015-02-17 NOTE — Telephone Encounter (Signed)
I have spoken with Anita Leblanc this morning and advised I am not sure who may have called her on 02-13-15.  I do not see a telephone note from that date. Dr. Epimenio Foot did order an mri brain that has not been done due to ins. not covering--I will forward this message to [facility] to see if possibly she called pt./fim

## 2015-04-29 ENCOUNTER — Ambulatory Visit: Payer: Self-pay | Admitting: Neurology

## 2015-05-01 ENCOUNTER — Encounter: Payer: Self-pay | Admitting: Neurology
